# Patient Record
Sex: Male | Born: 1980 | Race: White | Hispanic: No | Marital: Single | Smoking: Former smoker
Health system: Southern US, Community
[De-identification: ages and names within clinical notes are randomized; demographics above are authoritative.]

## PROBLEM LIST (undated history)

## (undated) DIAGNOSIS — M1990 Unspecified osteoarthritis, unspecified site: Secondary | ICD-10-CM

## (undated) DIAGNOSIS — M7918 Myalgia, other site: Secondary | ICD-10-CM

## (undated) DIAGNOSIS — M5430 Sciatica, unspecified side: Secondary | ICD-10-CM

## (undated) DIAGNOSIS — F32A Depression, unspecified: Secondary | ICD-10-CM

## (undated) DIAGNOSIS — M549 Dorsalgia, unspecified: Secondary | ICD-10-CM

## (undated) DIAGNOSIS — F329 Major depressive disorder, single episode, unspecified: Secondary | ICD-10-CM

## (undated) DIAGNOSIS — F419 Anxiety disorder, unspecified: Secondary | ICD-10-CM

## (undated) DIAGNOSIS — N200 Calculus of kidney: Secondary | ICD-10-CM

## (undated) DIAGNOSIS — F17200 Nicotine dependence, unspecified, uncomplicated: Secondary | ICD-10-CM

## (undated) DIAGNOSIS — F649 Gender identity disorder, unspecified: Secondary | ICD-10-CM

## (undated) HISTORY — DX: Depression, unspecified: F32.A

## (undated) HISTORY — DX: Gender identity disorder, unspecified: F64.9

## (undated) HISTORY — PX: TONSILLECTOMY: SUR1361

## (undated) HISTORY — DX: Nicotine dependence, unspecified, uncomplicated: F17.200

## (undated) HISTORY — DX: Anxiety disorder, unspecified: F41.9

## (undated) HISTORY — DX: Major depressive disorder, single episode, unspecified: F32.9

---
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
## 2001-12-30 ENCOUNTER — Encounter: Payer: Self-pay | Admitting: Family Medicine

## 2001-12-30 ENCOUNTER — Encounter: Admission: RE | Admit: 2001-12-30 | Discharge: 2001-12-30 | Payer: Self-pay | Admitting: Family Medicine

## 2002-06-19 ENCOUNTER — Encounter: Payer: Self-pay | Admitting: Emergency Medicine

## 2002-06-19 ENCOUNTER — Emergency Department (HOSPITAL_COMMUNITY): Admission: EM | Admit: 2002-06-19 | Discharge: 2002-06-19 | Payer: Self-pay | Admitting: Emergency Medicine

## 2005-06-06 ENCOUNTER — Emergency Department (HOSPITAL_COMMUNITY): Admission: AD | Admit: 2005-06-06 | Discharge: 2005-06-06 | Payer: Self-pay | Admitting: Family Medicine

## 2005-11-20 ENCOUNTER — Emergency Department (HOSPITAL_COMMUNITY): Admission: EM | Admit: 2005-11-20 | Discharge: 2005-11-20 | Payer: Self-pay | Admitting: Emergency Medicine

## 2006-11-10 ENCOUNTER — Emergency Department (HOSPITAL_COMMUNITY): Admission: EM | Admit: 2006-11-10 | Discharge: 2006-11-10 | Payer: Self-pay | Admitting: Emergency Medicine

## 2006-11-13 ENCOUNTER — Emergency Department (HOSPITAL_COMMUNITY): Admission: EM | Admit: 2006-11-13 | Discharge: 2006-11-13 | Payer: Self-pay | Admitting: Emergency Medicine

## 2007-01-07 ENCOUNTER — Emergency Department (HOSPITAL_COMMUNITY): Admission: EM | Admit: 2007-01-07 | Discharge: 2007-01-07 | Payer: Self-pay | Admitting: Emergency Medicine

## 2008-06-30 ENCOUNTER — Emergency Department (HOSPITAL_COMMUNITY): Admission: EM | Admit: 2008-06-30 | Discharge: 2008-06-30 | Payer: Self-pay | Admitting: Emergency Medicine

## 2009-08-30 ENCOUNTER — Observation Stay (HOSPITAL_COMMUNITY): Admission: EM | Admit: 2009-08-30 | Discharge: 2009-08-30 | Payer: Self-pay | Admitting: Emergency Medicine

## 2010-06-09 LAB — URINE MICROSCOPIC-ADD ON

## 2010-06-09 LAB — URINALYSIS, ROUTINE W REFLEX MICROSCOPIC: Urobilinogen, UA: 1 mg/dL (ref 0.0–1.0)

## 2011-01-02 LAB — URINALYSIS, ROUTINE W REFLEX MICROSCOPIC
Ketones, ur: NEGATIVE
Nitrite: NEGATIVE
Protein, ur: NEGATIVE
Specific Gravity, Urine: 1.019
Specific Gravity, Urine: 1.022
Urobilinogen, UA: 0.2
Urobilinogen, UA: 1
pH: 6

## 2011-01-02 LAB — I-STAT 8, (EC8 V) (CONVERTED LAB)
Acid-Base Excess: 1
Operator id: 270111
TCO2: 29
pH, Ven: 7.346 — ABNORMAL HIGH

## 2011-01-02 LAB — URINE MICROSCOPIC-ADD ON

## 2011-11-20 ENCOUNTER — Ambulatory Visit: Payer: BC Managed Care – PPO | Attending: Physical Medicine and Rehabilitation | Admitting: Rehabilitation

## 2011-11-20 DIAGNOSIS — IMO0001 Reserved for inherently not codable concepts without codable children: Secondary | ICD-10-CM | POA: Insufficient documentation

## 2011-11-20 DIAGNOSIS — M545 Low back pain, unspecified: Secondary | ICD-10-CM | POA: Insufficient documentation

## 2011-11-20 DIAGNOSIS — M546 Pain in thoracic spine: Secondary | ICD-10-CM | POA: Insufficient documentation

## 2011-11-25 ENCOUNTER — Ambulatory Visit: Payer: BC Managed Care – PPO | Attending: Physical Medicine and Rehabilitation | Admitting: Rehabilitation

## 2011-11-25 DIAGNOSIS — M545 Low back pain, unspecified: Secondary | ICD-10-CM | POA: Insufficient documentation

## 2011-11-25 DIAGNOSIS — M546 Pain in thoracic spine: Secondary | ICD-10-CM | POA: Insufficient documentation

## 2011-11-25 DIAGNOSIS — IMO0001 Reserved for inherently not codable concepts without codable children: Secondary | ICD-10-CM | POA: Insufficient documentation

## 2011-12-02 ENCOUNTER — Ambulatory Visit: Payer: BC Managed Care – PPO | Admitting: Rehabilitation

## 2011-12-03 ENCOUNTER — Ambulatory Visit: Payer: BC Managed Care – PPO | Admitting: Rehabilitation

## 2011-12-04 ENCOUNTER — Encounter: Payer: BC Managed Care – PPO | Admitting: Rehabilitation

## 2011-12-16 ENCOUNTER — Ambulatory Visit: Payer: BC Managed Care – PPO | Admitting: Rehabilitation

## 2011-12-17 ENCOUNTER — Ambulatory Visit: Payer: BC Managed Care – PPO | Admitting: Rehabilitation

## 2011-12-18 ENCOUNTER — Ambulatory Visit: Payer: BC Managed Care – PPO | Admitting: Rehabilitation

## 2011-12-23 ENCOUNTER — Ambulatory Visit: Payer: BC Managed Care – PPO | Attending: Physical Medicine and Rehabilitation | Admitting: Rehabilitation

## 2011-12-23 DIAGNOSIS — M546 Pain in thoracic spine: Secondary | ICD-10-CM | POA: Insufficient documentation

## 2011-12-23 DIAGNOSIS — IMO0001 Reserved for inherently not codable concepts without codable children: Secondary | ICD-10-CM | POA: Insufficient documentation

## 2011-12-23 DIAGNOSIS — M545 Low back pain, unspecified: Secondary | ICD-10-CM | POA: Insufficient documentation

## 2012-10-25 ENCOUNTER — Other Ambulatory Visit: Payer: Self-pay | Admitting: Physician Assistant

## 2012-10-25 DIAGNOSIS — M47817 Spondylosis without myelopathy or radiculopathy, lumbosacral region: Secondary | ICD-10-CM

## 2012-11-02 ENCOUNTER — Other Ambulatory Visit: Payer: Self-pay | Admitting: Physician Assistant

## 2012-11-02 ENCOUNTER — Ambulatory Visit
Admission: RE | Admit: 2012-11-02 | Discharge: 2012-11-02 | Disposition: A | Discharge status: Home / Self Care | Payer: BC Managed Care – PPO | Source: Ambulatory Visit | Attending: Physician Assistant | Admitting: Physician Assistant

## 2012-11-02 VITALS — BP 135/79 | HR 69

## 2012-11-02 DIAGNOSIS — M47817 Spondylosis without myelopathy or radiculopathy, lumbosacral region: Secondary | ICD-10-CM

## 2012-11-02 DIAGNOSIS — M5126 Other intervertebral disc displacement, lumbar region: Secondary | ICD-10-CM

## 2012-11-02 MED ORDER — IOHEXOL 300 MG/ML  SOLN
1.0000 mL | Freq: Once | INTRAMUSCULAR | Status: AC | PRN
  Administered 2012-11-02: 2 mL via EPIDURAL

## 2012-11-02 MED ORDER — METHYLPREDNISOLONE ACETATE 40 MG/ML INJ SUSP (RADIOLOG
120.0000 mg | Freq: Once | INTRAMUSCULAR | Status: AC
  Administered 2012-11-02: 120 mg via EPIDURAL

## 2013-02-15 ENCOUNTER — Emergency Department (HOSPITAL_COMMUNITY)
Admission: EM | Admit: 2013-02-15 | Discharge: 2013-02-15 | Disposition: A | Discharge status: Home / Self Care | Payer: BC Managed Care – PPO | Attending: Emergency Medicine | Admitting: Emergency Medicine

## 2013-02-15 ENCOUNTER — Emergency Department (HOSPITAL_COMMUNITY): Payer: BC Managed Care – PPO

## 2013-02-15 ENCOUNTER — Encounter (HOSPITAL_COMMUNITY): Payer: Self-pay | Admitting: Emergency Medicine

## 2013-02-15 DIAGNOSIS — N200 Calculus of kidney: Secondary | ICD-10-CM | POA: Insufficient documentation

## 2013-02-15 DIAGNOSIS — Z79899 Other long term (current) drug therapy: Secondary | ICD-10-CM | POA: Insufficient documentation

## 2013-02-15 DIAGNOSIS — R112 Nausea with vomiting, unspecified: Secondary | ICD-10-CM | POA: Insufficient documentation

## 2013-02-15 DIAGNOSIS — Z8739 Personal history of other diseases of the musculoskeletal system and connective tissue: Secondary | ICD-10-CM | POA: Insufficient documentation

## 2013-02-15 HISTORY — DX: Calculus of kidney: N20.0

## 2013-02-15 HISTORY — DX: Dorsalgia, unspecified: M54.9

## 2013-02-15 HISTORY — DX: Unspecified osteoarthritis, unspecified site: M19.90

## 2013-02-15 LAB — URINALYSIS, ROUTINE W REFLEX MICROSCOPIC
Glucose, UA: NEGATIVE mg/dL
Ketones, ur: NEGATIVE mg/dL
pH: 5.5 (ref 5.0–8.0)

## 2013-02-15 LAB — BASIC METABOLIC PANEL
BUN: 12 mg/dL (ref 6–23)
Calcium: 9.2 mg/dL (ref 8.4–10.5)
Chloride: 106 mEq/L (ref 96–112)
GFR calc non Af Amer: >90 mL/min (ref >90)

## 2013-02-15 LAB — URINE MICROSCOPIC-ADD ON

## 2013-02-15 MED ORDER — ONDANSETRON 4 MG PO TBDP: 4.0000 mg | ORAL_TABLET | Freq: Three times a day (TID) | ORAL | Status: DC | PRN

## 2013-02-15 MED ORDER — HYDROMORPHONE HCL PF 1 MG/ML IJ SOLN
1.0000 mg | Freq: Once | INTRAMUSCULAR | Status: AC
  Administered 2013-02-15: 1 mg via INTRAVENOUS
  Filled 2013-02-15: qty 1

## 2013-02-15 MED ORDER — KETOROLAC TROMETHAMINE 10 MG PO TABS: 10.0000 mg | ORAL_TABLET | Freq: Four times a day (QID) | ORAL | Status: DC | PRN

## 2013-02-15 MED ORDER — KETOROLAC TROMETHAMINE 30 MG/ML IJ SOLN
30.0000 mg | Freq: Once | INTRAMUSCULAR | Status: AC
  Administered 2013-02-15: 30 mg via INTRAVENOUS
  Filled 2013-02-15: qty 1

## 2013-02-15 MED ORDER — TAMSULOSIN HCL 0.4 MG PO CAPS: 0.4000 mg | ORAL_CAPSULE | Freq: Every day | ORAL | Status: DC

## 2013-02-15 MED ORDER — ONDANSETRON HCL 4 MG/2ML IJ SOLN
4.0000 mg | Freq: Once | INTRAMUSCULAR | Status: AC
  Administered 2013-02-15: 4 mg via INTRAVENOUS
  Filled 2013-02-15: qty 2

## 2013-02-15 NOTE — ED Notes (Signed)
Pt states "I have a kidney stone." Pt states he has had them for years. Pt states pain on right lower side. Pt states blood in urine. Pt states nausea without vomiting. Pt states very painful. Skin warm and dry. NAD noted at this time. Pt ambulatory.

## 2013-02-15 NOTE — ED Provider Notes (Signed)
Medical screening examination/treatment/procedure(s) were performed by non-physician practitioner and as supervising physician I was immediately available for consultation/collaboration.  EKG Interpretation   None         Anayelli Lai N Daivd Fredericksen, DO 02/15/13 1703 

## 2013-02-15 NOTE — ED Notes (Signed)
Pt alert and mentating appropriately upon d/c. NAD noted upon d/c. Pt given d/c teaching, follow up care instructions and prescriptions. Pt verbalizes understanding of d/c teaching and has no further questions. Pt instructed not to drive and endorses significant other at bedside will be driving home. Pt ambulatory with steady gait leaving ER.

## 2013-02-15 NOTE — ED Provider Notes (Signed)
CSN: [id number]     Arrival date & time 02/15/13  4098 History   First MD Initiated Contact with Patient 02/15/13 [phone]     Chief Complaint  Patient presents with  . Nephrolithiasis   (Consider location/radiation/quality/duration/timing/severity/associated sxs/prior Treatment) HPI Comments: Patient with a history of kidney stones presents today with a chief complaint of right flank pain.  He reports that the pain has been present since yesterday.  The pain becomes worse at times.  Pain radiates to the right groin.  He reports that the pain feels similar to previous kidney stones. He also reports some associated hematuria, nausea, and vomiting.  Denies any other urinary symptoms.  Denies fever or chills.  Denies urinary retention.  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  He states that in the past he has been able to pass his kidney stones without intervention.    The history is provided by the patient.    Past Medical History  Diagnosis Date  . Kidney stones   . DJD (degenerative joint disease)   . Back pain    No past surgical history on file. No family history on file. History  Substance Use Topics  . Smoking status: Not on file  . Smokeless tobacco: Not on file  . Alcohol Use: Not on file    Review of Systems  All other systems reviewed and are negative.    Allergies  Erythromycin  Home Medications   Current Outpatient Rx  Name  Route  Sig  Dispense  Refill  . ALPRAZolam (XANAX) 1 MG tablet   Oral   Take 1 mg by mouth 2 (two) times daily.         [name] amphetamine-dextroamphetamine (ADDERALL) 20 MG tablet   Oral   Take 20 mg by mouth 3 (three) times daily.         [name] Desvenlafaxine ER (KHEDEZLA) 50 MG TB24   Oral   Take 50 mg by mouth daily.         [name] HYDROmorphone (DILAUDID) 8 MG tablet   Oral   Take 8 mg by mouth 3 (three) times daily.         . methocarbamol (ROBAXIN) 500 MG tablet   Oral   Take 500 mg by mouth 3 (three) times  daily.         . traZODone (DESYREL) 100 MG tablet   Oral   Take 100 mg by mouth at bedtime.          BP 159/93  Pulse 86  Temp(Src) 98.2 F (36.8 C) (Oral)  Resp 20  SpO2 100% Physical Exam  Nursing note and vitals reviewed. Constitutional: He appears well-developed and well-nourished.  HENT:  Head: Normocephalic and atraumatic.  Mouth/Throat: Oropharynx is clear and moist.  Neck: Normal range of motion. Neck supple.  Cardiovascular: Normal rate, regular rhythm and normal heart sounds.   Pulmonary/Chest: Effort normal and breath sounds normal.  Abdominal: Soft. Bowel sounds are normal. He exhibits no distension and no mass. There is tenderness in the right lower quadrant. There is no rigidity, no rebound and no guarding.  Right CVA tenderness  Musculoskeletal: Normal range of motion.  Neurological: He is alert.  Skin: Skin is warm and dry.  Psychiatric: He has a normal mood and affect.    ED Course  Procedures (including critical care time) Labs Review Labs Reviewed  URINALYSIS, ROUTINE W REFLEX MICROSCOPIC  BASIC METABOLIC PANEL   Imaging Review Ct Abdomen Pelvis Wo  Contrast  02/15/2013   CLINICAL DATA:  Right flank pain with a history of kidney stones  EXAM: CT ABDOMEN AND PELVIS WITHOUT CONTRAST  TECHNIQUE: Multidetector CT imaging of the abdomen and pelvis was performed following the standard protocol without intravenous contrast.  COMPARISON:  08/30/2009  FINDINGS: No focal abnormality is seen in the liver or spleen on this study performed without intravenous contrast material. The stomach, duodenum, pancreas, gallbladder, and adrenal glands are unremarkable.  No stones are present in the left kidney. No evidence for stones in the right kidney. There is mild right hydroureteronephrosis down to the level of the distal ureter where a 2 x 4 mm stone is identified about 1 cm proximal to the UVJ. No left ureteral stones. No evidence for stones in the urinary bladder.   Left-sided IVC noted. There is no abdominal aortic aneurysm. No free fluid or lymphadenopathy in the abdomen.  Imaging through the pelvis shows no free intraperitoneal fluid. No pelvic sidewall lymphadenopathy. No substantial diverticular disease in the colon. No colonic diverticulitis. The terminal ileum is normal. The appendix is normal.  Bone windows reveal no worrisome lytic or sclerotic osseous lesions.  IMPRESSION: 2 x 4 mm distal right ureteral stone causes mild right hydroureteronephrosis.   Electronically Signed   By: Kennith Center M.D.   On: 02/15/2013 11:02    EKG Interpretation   None      12:02 PM Reassessed patient.  He reports that his pain is "much better" at this time.  Patient tolerating PO liquids. MDM  No diagnosis found. Pt has been diagnosed with a Kidney Stone via CT. There is no evidence of significant hydronephrosis, serum creatine WNL, vitals sign stable and the pt does not have irratractable vomiting.  UA does not show evidence of infection.  Pain controlled during ED course.  Pt will be dc home with pain medications & has been advised to follow up with Urology.  Return precautions given.     Santiago Glad, PA-C 02/15/13 1213

## 2013-02-18 ENCOUNTER — Encounter (HOSPITAL_COMMUNITY): Payer: Self-pay | Admitting: Emergency Medicine

## 2013-02-18 ENCOUNTER — Emergency Department (HOSPITAL_COMMUNITY)
Admission: EM | Admit: 2013-02-18 | Discharge: 2013-02-18 | Disposition: A | Discharge status: Home / Self Care | Payer: BC Managed Care – PPO | Attending: Emergency Medicine | Admitting: Emergency Medicine

## 2013-02-18 DIAGNOSIS — Z881 Allergy status to other antibiotic agents status: Secondary | ICD-10-CM | POA: Insufficient documentation

## 2013-02-18 DIAGNOSIS — Z79899 Other long term (current) drug therapy: Secondary | ICD-10-CM | POA: Insufficient documentation

## 2013-02-18 DIAGNOSIS — R112 Nausea with vomiting, unspecified: Secondary | ICD-10-CM | POA: Insufficient documentation

## 2013-02-18 DIAGNOSIS — M1990 Unspecified osteoarthritis, unspecified site: Secondary | ICD-10-CM | POA: Insufficient documentation

## 2013-02-18 DIAGNOSIS — N23 Unspecified renal colic: Secondary | ICD-10-CM | POA: Diagnosis present

## 2013-02-18 DIAGNOSIS — Z87442 Personal history of urinary calculi: Secondary | ICD-10-CM | POA: Insufficient documentation

## 2013-02-18 DIAGNOSIS — Z87891 Personal history of nicotine dependence: Secondary | ICD-10-CM | POA: Insufficient documentation

## 2013-02-18 LAB — CBC WITH DIFFERENTIAL/PLATELET
Basophils Relative: 1 % (ref 0–1)
HCT: 41.5 % (ref 39.0–52.0)
Hemoglobin: 14.8 g/dL (ref 13.0–17.0)
Lymphs Abs: 2.7 10*3/uL (ref 0.7–4.0)
MCH: 31.8 pg (ref 26.0–34.0)
Monocytes Relative: 7 % (ref 3–12)
Neutro Abs: 4.3 10*3/uL (ref 1.7–7.7)
Neutrophils Relative %: 54 % (ref 43–77)
Platelets: 276 10*3/uL (ref 150–400)
RBC: 4.66 MIL/uL (ref 4.22–5.81)
WBC: 8 10*3/uL (ref 4.0–10.5)

## 2013-02-18 LAB — URINALYSIS W MICROSCOPIC + REFLEX CULTURE
Bilirubin Urine: NEGATIVE
Ketones, ur: NEGATIVE mg/dL
Nitrite: NEGATIVE
Specific Gravity, Urine: 1.012 (ref 1.005–1.030)
Urobilinogen, UA: 1 mg/dL (ref 0.0–1.0)
pH: 6.5 (ref 5.0–8.0)

## 2013-02-18 LAB — BASIC METABOLIC PANEL
BUN: 13 mg/dL (ref 6–23)
Chloride: 98 mEq/L (ref 96–112)
GFR calc Af Amer: >90 mL/min (ref >90)
GFR calc non Af Amer: >90 mL/min (ref >90)
Glucose, Bld: 130 mg/dL — ABNORMAL HIGH (ref 70–99)
Potassium: 3.3 mEq/L — ABNORMAL LOW (ref 3.5–5.1)
Sodium: 134 mEq/L — ABNORMAL LOW (ref 135–145)

## 2013-02-18 MED ORDER — PROMETHAZINE HCL 25 MG/ML IJ SOLN
25.0000 mg | Freq: Once | INTRAMUSCULAR | Status: AC
  Administered 2013-02-18: 25 mg via INTRAVENOUS
  Filled 2013-02-18: qty 1

## 2013-02-18 MED ORDER — KETOROLAC TROMETHAMINE 30 MG/ML IJ SOLN
30.0000 mg | Freq: Once | INTRAMUSCULAR | Status: AC
  Administered 2013-02-18: 30 mg via INTRAVENOUS
  Filled 2013-02-18: qty 1

## 2013-02-18 MED ORDER — SODIUM CHLORIDE 0.9 % IV BOLUS (SEPSIS)
1000.0000 mL | INTRAVENOUS | Status: AC
  Administered 2013-02-18: 1000 mL via INTRAVENOUS

## 2013-02-18 MED ORDER — HYDROMORPHONE HCL PF 1 MG/ML IJ SOLN
1.0000 mg | INTRAMUSCULAR | Status: AC
  Administered 2013-02-18: 1 mg via INTRAVENOUS
  Filled 2013-02-18: qty 1

## 2013-02-18 MED ORDER — PROMETHAZINE HCL 25 MG PO TABS: 25.0000 mg | ORAL_TABLET | Freq: Four times a day (QID) | ORAL | Status: DC | PRN

## 2013-02-18 NOTE — ED Provider Notes (Signed)
CSN: [id number]     Arrival date & time 02/18/13  0100 History   First MD Initiated Contact with Patient 02/18/13 0115     Chief Complaint  Patient presents with  . Flank Pain   (Consider location/radiation/quality/duration/timing/severity/associated sxs/prior Treatment) Patient is a 32 y.o. male presenting with flank pain. The history is provided by the patient.  Flank Pain This is a new problem. Episode onset: 3 days ago. The problem occurs constantly. The problem has not changed since onset.Pertinent negatives include no chest pain, no abdominal pain, no headaches and no shortness of breath. Nothing aggravates the symptoms. Nothing relieves the symptoms. Treatments tried: dilaudid, zofran. The treatment provided mild relief.    Past Medical History  Diagnosis Date  . Kidney stones   . DJD (degenerative joint disease)   . Back pain    History reviewed. No pertinent past surgical history. No family history on file. History  Substance Use Topics  . Smoking status: Former Games developer  . Smokeless tobacco: Not on file  . Alcohol Use: Not on file    Review of Systems  Constitutional: Negative for fever.  HENT: Negative for drooling and rhinorrhea.   Eyes: Negative for pain.  Respiratory: Negative for cough and shortness of breath.   Cardiovascular: Negative for chest pain and leg swelling.  Gastrointestinal: Positive for nausea and vomiting. Negative for abdominal pain and diarrhea.  Genitourinary: Positive for flank pain. Negative for dysuria and hematuria.  Musculoskeletal: Negative for gait problem and neck pain.  Skin: Negative for color change.  Neurological: Negative for numbness and headaches.  Hematological: Negative for adenopathy.  Psychiatric/Behavioral: Negative for behavioral problems.  All other systems reviewed and are negative.    Allergies  Erythromycin  Home Medications   Current Outpatient Rx  Name  Route  Sig  Dispense  Refill  . ALPRAZolam (XANAX) 1  MG tablet   Oral   Take 1 mg by mouth 2 (two) times daily.         [name] amphetamine-dextroamphetamine (ADDERALL) 20 MG tablet   Oral   Take 20 mg by mouth 3 (three) times daily.         [name] Desvenlafaxine ER (KHEDEZLA) 50 MG TB24   Oral   Take 50 mg by mouth daily.         [name] HYDROmorphone (DILAUDID) 8 MG tablet   Oral   Take 8 mg by mouth 3 (three) times daily.         [name] ketorolac (TORADOL) 10 MG tablet   Oral   Take 1 tablet (10 mg total) by mouth every 6 (six) hours as needed.   20 tablet   0   . methocarbamol (ROBAXIN) 500 MG tablet   Oral   Take 500 mg by mouth 3 (three) times daily.         . ondansetron (ZOFRAN ODT) 4 MG disintegrating tablet   Oral   Take 1 tablet (4 mg total) by mouth every 8 (eight) hours as needed for nausea or vomiting.   20 tablet   0   . tamsulosin (FLOMAX) 0.4 MG CAPS capsule   Oral   Take 1 capsule (0.4 mg total) by mouth daily.   30 capsule   0   . traZODone (DESYREL) 100 MG tablet   Oral   Take 100 mg by mouth at bedtime.          BP 152/92  Pulse 78  Temp(Src) 98.9 F (37.2 C) (Oral)  Resp 18  Ht 6\' 1"  (1.854 m)  Wt 210 lb (95.255 kg)  BMI 27.71 kg/m2  SpO2 97% Physical Exam  Nursing note and vitals reviewed. Constitutional: He is oriented to person, place, and time. He appears well-developed and well-nourished.  HENT:  Head: Normocephalic and atraumatic.  Right Ear: External ear normal.  Left Ear: External ear normal.  Nose: Nose normal.  Mouth/Throat: Oropharynx is clear and moist. No oropharyngeal exudate.  Eyes: Conjunctivae and EOM are normal. Pupils are equal, round, and reactive to light.  Neck: Normal range of motion. Neck supple.  Cardiovascular: Normal rate, regular rhythm, normal heart sounds and intact distal pulses.  Exam reveals no gallop and no friction rub.   No murmur heard. Pulmonary/Chest: Effort normal and breath sounds normal. No respiratory distress. He has no wheezes.  Abdominal: Soft.  Bowel sounds are normal. He exhibits no distension. There is no tenderness. There is no rebound and no guarding.  Musculoskeletal: Normal range of motion. He exhibits no edema and no tenderness.  Right CVA tenderness to palpation.  Neurological: He is alert and oriented to person, place, and time.  Skin: Skin is warm and dry.  Psychiatric: He has a normal mood and affect. His behavior is normal.    ED Course  Procedures (including critical care time) Labs Review Labs Reviewed  BASIC METABOLIC PANEL - Abnormal; Notable for the following:    Sodium 134 (*)    Potassium 3.3 (*)    Glucose, Bld 130 (*)    All other components within normal limits  URINALYSIS W MICROSCOPIC + REFLEX CULTURE - Abnormal; Notable for the following:    Hgb urine dipstick SMALL (*)    All other components within normal limits  CBC WITH DIFFERENTIAL   Imaging Review No results found.  EKG Interpretation   None       MDM   1. Renal colic    1:34 AM 32 y.o. male with a history of kidney stones presents with ongoing right flank pain. The patient was seen here several days ago and found to have a kidney stone. CT showed a 2 x 4 mm distal right ureteral stone causes mild right hydroureteronephrosis. The patient states that he takes po the lauded at home for back pain. He states that he has had ongoing right flank pain as well as nausea and some mild vomiting. He states that he feels like he can't hold down the pain medicine due to the vomiting. He denies any fevers or dysuria. He is afebrile and vital signs are unremarkable here. Will repeat screening labwork and get symptom control.  5:01 AM: Pt feeling much better. I interpreted/reviewed the labs and/or imaging which were non-contributory.   I have discussed the diagnosis/risks/treatment options with the patient and believe the pt to be eligible for discharge home to follow-up with Urology as needed. We also discussed returning to the ED immediately if new or  worsening sx occur. We discussed the sx which are most concerning (e.g., inability to tol po, fever, worsening pain) that necessitate immediate return. Any new prescriptions provided to the patient are listed below.  New Prescriptions   PROMETHAZINE (PHENERGAN) 25 MG TABLET    Take 1 tablet (25 mg total) by mouth every 6 (six) hours as needed for nausea or vomiting.     Junius Argyle, MD 02/18/13 [phone]

## 2013-02-18 NOTE — ED Notes (Signed)
Pt here for same complaint on 02/15/13. Was diagnosed with a kidney stone, given rx and told to follow up with urology when he passes the kidney stone. States that he cannot take meds due to nausea and does not want to throw up.

## 2017-03-26 DIAGNOSIS — G894 Chronic pain syndrome: Secondary | ICD-10-CM | POA: Insufficient documentation

## 2017-04-02 DIAGNOSIS — M546 Pain in thoracic spine: Secondary | ICD-10-CM | POA: Insufficient documentation

## 2017-05-11 ENCOUNTER — Emergency Department
Admission: EM | Admit: 2017-05-11 | Discharge: 2017-05-11 | Disposition: A | Discharge status: Home / Self Care | Payer: Self-pay | Attending: Emergency Medicine | Admitting: Emergency Medicine

## 2017-05-11 ENCOUNTER — Emergency Department: Payer: Self-pay

## 2017-05-11 ENCOUNTER — Other Ambulatory Visit: Payer: Self-pay

## 2017-05-11 ENCOUNTER — Encounter: Payer: Self-pay | Admitting: Emergency Medicine

## 2017-05-11 DIAGNOSIS — E860 Dehydration: Secondary | ICD-10-CM

## 2017-05-11 DIAGNOSIS — Z87891 Personal history of nicotine dependence: Secondary | ICD-10-CM | POA: Insufficient documentation

## 2017-05-11 DIAGNOSIS — R109 Unspecified abdominal pain: Secondary | ICD-10-CM

## 2017-05-11 DIAGNOSIS — R112 Nausea with vomiting, unspecified: Secondary | ICD-10-CM

## 2017-05-11 DIAGNOSIS — K2900 Acute gastritis without bleeding: Secondary | ICD-10-CM

## 2017-05-11 DIAGNOSIS — R1013 Epigastric pain: Secondary | ICD-10-CM | POA: Insufficient documentation

## 2017-05-11 DIAGNOSIS — Z79899 Other long term (current) drug therapy: Secondary | ICD-10-CM | POA: Insufficient documentation

## 2017-05-11 HISTORY — DX: Sciatica, unspecified side: M54.30

## 2017-05-11 HISTORY — DX: Myalgia, other site: M79.18

## 2017-05-11 LAB — COMPREHENSIVE METABOLIC PANEL
ALBUMIN: 4.9 g/dL (ref 3.5–5.0)
ALT: 15 U/L — ABNORMAL LOW (ref 17–63)
AST: 20 U/L (ref 15–41)
Alkaline Phosphatase: 59 U/L (ref 38–126)
Anion gap: 14 (ref 5–15)
BUN: 21 mg/dL — ABNORMAL HIGH (ref 6–20)
CALCIUM: 9.2 mg/dL (ref 8.9–10.3)
CO2: 21 mmol/L — ABNORMAL LOW (ref 22–32)
Chloride: 101 mmol/L (ref 101–111)
Creatinine, Ser: 0.98 mg/dL (ref 0.61–1.24)
GFR calc Af Amer: >60 mL/min (ref >60)
GFR calc non Af Amer: >60 mL/min (ref >60)
GLUCOSE: 117 mg/dL — AB (ref 65–99)
Potassium: 3.3 mmol/L — ABNORMAL LOW (ref 3.5–5.1)
Sodium: 136 mmol/L (ref 135–145)
Total Bilirubin: 1.6 mg/dL — ABNORMAL HIGH (ref 0.3–1.2)
Total Protein: 8.1 g/dL (ref 6.5–8.1)

## 2017-05-11 LAB — CBC
HEMATOCRIT: 52.4 % — AB (ref 40.0–52.0)
Hemoglobin: 18.1 g/dL — ABNORMAL HIGH (ref 13.0–18.0)
MCH: 30.2 pg (ref 26.0–34.0)
MCHC: 34.6 g/dL (ref 32.0–36.0)
MCV: 87.3 fL (ref 80.0–100.0)
Platelets: 380 10*3/uL (ref 150–440)
RBC: 6.01 MIL/uL — AB (ref 4.40–5.90)
RDW: 13.4 % (ref 11.5–14.5)
WBC: 11.1 10*3/uL — ABNORMAL HIGH (ref 3.8–10.6)

## 2017-05-11 LAB — URINALYSIS, COMPLETE (UACMP) WITH MICROSCOPIC
BACTERIA UA: NONE SEEN
BILIRUBIN URINE: NEGATIVE
Glucose, UA: NEGATIVE mg/dL
Hgb urine dipstick: NEGATIVE
KETONES UR: 80 mg/dL — AB
Leukocytes, UA: NEGATIVE
Nitrite: NEGATIVE
PH: 5 (ref 5.0–8.0)
PROTEIN: 30 mg/dL — AB
Specific Gravity, Urine: 1.028 (ref 1.005–1.030)

## 2017-05-11 LAB — TROPONIN I: Troponin I: <0.03 ng/mL (ref <0.03)

## 2017-05-11 LAB — LIPASE, BLOOD: LIPASE: 24 U/L (ref 11–51)

## 2017-05-11 MED ORDER — FAMOTIDINE 20 MG IN NS 100 ML IVPB: 20.0000 mg | Freq: Once | INTRAVENOUS | Status: DC

## 2017-05-11 MED ORDER — FAMOTIDINE IN NACL 20-0.9 MG/50ML-% IV SOLN
20.0000 mg | Freq: Once | INTRAVENOUS | Status: AC
  Administered 2017-05-11: 20 mg via INTRAVENOUS
  Filled 2017-05-11: qty 50

## 2017-05-11 MED ORDER — FAMOTIDINE 40 MG PO TABS: 40.0000 mg | ORAL_TABLET | Freq: Every day | ORAL | 0 refills | Status: DC

## 2017-05-11 MED ORDER — METOCLOPRAMIDE HCL 5 MG/ML IJ SOLN
10.0000 mg | Freq: Once | INTRAMUSCULAR | Status: AC
  Administered 2017-05-11: 10 mg via INTRAVENOUS
  Filled 2017-05-11: qty 2

## 2017-05-11 MED ORDER — ACETAMINOPHEN 500 MG PO TABS
1000.0000 mg | ORAL_TABLET | Freq: Once | ORAL | Status: AC
  Administered 2017-05-11: 1000 mg via ORAL
  Filled 2017-05-11: qty 2

## 2017-05-11 NOTE — ED Notes (Signed)
Pt given ginger ale, ok per Dr. Don PerkingVeronese

## 2017-05-11 NOTE — ED Triage Notes (Signed)
Pt to ed with c/o vomiting since Friday.  Pt denies diarrhea.  States last bm was Thursday and normal.  Pt also reports recent abd pain after taking pain meds at home. Pt reports he is on duragesic and dialudid for DDD, and myofacial syndrome.

## 2017-05-11 NOTE — ED Provider Notes (Signed)
[facility] Emergency Department Provider Note  ____________________________________________  Time seen: Approximately 11:02 AM  I have reviewed the triage vital signs and the nursing notes.   HISTORY  Chief Complaint Abdominal Pain and Emesis   HPI Hunter Lynch is a 37 y.o. male with a history of a chronic DJD/myofascial pain on a pain contract and chronic Dilaudid and fentanyl and kidney stoneswho presents for evaluation of nausea and vomiting. Patient reports 4 days of several daily episodes of nonbloody nonbilious emesis. Has been unable to tolerate by mouth. Few days ago the vomiting looked dark however patient is on CBD oil which also looks dark. No coffee-ground emesis or hematemesis, no melena or hematochezia. Last bowel movement was 5 days ago which is atypical for patient. He denies a history of constipation. Is also complaining of pain in the epigastric region which he describes as a burning sensation constant since this am and nonradiating. That this pain has been intermittent over the last few days. Patient is concerned that he might have an ulcer since he has been taking 3-4 naproxen that day for the last week. No prior abdominal surgeries. No fever, chills, dysuria, hematuria, chest pain or shortness of breath  Past Medical History:  Diagnosis Date  . Back pain   . DJD (degenerative joint disease)   . Kidney stones   . Myofascial pain   . Sciatica     Patient Active Problem List   Diagnosis Date Noted  . Renal colic 02/18/2013    History reviewed. No pertinent surgical history.  Prior to Admission medications   Medication Sig Start Date End Date Taking? Authorizing Provider  ALPRAZolam Prudy Feeler) 1 MG tablet Take 1 mg by mouth 2 (two) times daily as needed for anxiety.     [provider]  amphetamine-dextroamphetamine (ADDERALL) 20 MG tablet Take 20 mg by mouth 3 (three) times daily as needed (make take up to three times  daily depending on school and work schedule).     [provider]  Desvenlafaxine ER (KHEDEZLA) 50 MG TB24 Take 50 mg by mouth daily.    [provider]  famotidine (PEPCID) 40 MG tablet Take 1 tablet (40 mg total) by mouth at bedtime. 05/11/17 06/10/17  Nita Sickle, MD  HYDROmorphone (DILAUDID) 8 MG tablet Take 8 mg by mouth 3 (three) times daily. Scheduled doses for degenerative disc disease    [provider]  ketorolac (TORADOL) 10 MG tablet Take 1 tablet (10 mg total) by mouth every 6 (six) hours as needed. 02/15/13   Santiago Glad, PA-C  methocarbamol (ROBAXIN) 500 MG tablet Take 500 mg by mouth every 8 (eight) hours as needed for muscle spasms.     [provider]  ondansetron (ZOFRAN ODT) 4 MG disintegrating tablet Take 1 tablet (4 mg total) by mouth every 8 (eight) hours as needed for nausea or vomiting. 02/15/13   Santiago Glad, PA-C  promethazine (PHENERGAN) 25 MG tablet Take 1 tablet (25 mg total) by mouth every 6 (six) hours as needed for nausea or vomiting. 02/18/13   Purvis Sheffield, MD  tamsulosin (FLOMAX) 0.4 MG CAPS capsule Take 1 capsule (0.4 mg total) by mouth daily. 02/15/13   Santiago Glad, PA-C  traZODone (DESYREL) 100 MG tablet Take 100 mg by mouth at bedtime as needed.     [provider]    Allergies Erythromycin  History reviewed. No pertinent family history.  Social History Social History   Tobacco Use  . Smoking  status: Former Games developermoker  . Smokeless tobacco: Never Used  Substance Use Topics  . Alcohol use: No    Frequency: Never  . Drug use: No    Review of Systems  Constitutional: Negative for fever. Eyes: Negative for visual changes. ENT: Negative for sore throat. Neck: No neck pain  Cardiovascular: Negative for chest pain. Respiratory: Negative for shortness of breath. Gastrointestinal: + epigastric burning abdominal pain, nausea, and vomiting. No diarrhea. Genitourinary: Negative for  dysuria. Musculoskeletal: Negative for back pain. Skin: Negative for rash. Neurological: Negative for headaches, weakness or numbness. Psych: No SI or HI  ____________________________________________   PHYSICAL EXAM:  VITAL SIGNS: ED Triage Vitals [05/11/17 0853]  Enc Vitals Group     BP (!) 149/108     Pulse Rate 89     Resp 16     Temp 98.5 F (36.9 C)     Temp Source Oral     SpO2 100 %     Weight 190 lb (86.2 kg)     Height 6' (1.829 m)     Head Circumference      Peak Flow      Pain Score 8     Pain Loc      Pain Edu?      Excl. in GC?     Constitutional: Alert and oriented. Well appearing and in no apparent distress. HEENT:      Head: Normocephalic and atraumatic.         Eyes: Conjunctivae are normal. Sclera is non-icteric.       Mouth/Throat: Mucous membranes are moist.       Neck: Supple with no signs of meningismus. Cardiovascular: Regular rate and rhythm. No murmurs, gallops, or rubs. 2+ symmetrical distal pulses are present in all extremities. No JVD. Respiratory: Normal respiratory effort. Lungs are clear to auscultation bilaterally. No wheezes, crackles, or rhonchi.  Gastrointestinal: Soft, ttp over the epigastric region, and non distended with positive bowel sounds. No rebound or guarding. Genitourinary: No CVA tenderness. Musculoskeletal: Nontender with normal range of motion in all extremities. No edema, cyanosis, or erythema of extremities. Neurologic: Normal speech and language. Face is symmetric. Moving all extremities. No gross focal neurologic deficits are appreciated. Skin: Skin is warm, dry and intact. No rash noted. Psychiatric: Mood and affect are normal. Speech and behavior are normal.  ____________________________________________   LABS (all labs ordered are listed, but only abnormal results are displayed)  Labs Reviewed  COMPREHENSIVE METABOLIC PANEL - Abnormal; Notable for the following components:      Result Value   Potassium 3.3  (*)    CO2 21 (*)    Glucose, Bld 117 (*)    BUN 21 (*)    ALT 15 (*)    Total Bilirubin 1.6 (*)    All other components within normal limits  CBC - Abnormal; Notable for the following components:   WBC 11.1 (*)    RBC 6.01 (*)    Hemoglobin 18.1 (*)    HCT 52.4 (*)    All other components within normal limits  URINALYSIS, COMPLETE (UACMP) WITH MICROSCOPIC - Abnormal; Notable for the following components:   Color, Urine YELLOW (*)    APPearance HAZY (*)    Ketones, ur 80 (*)    Protein, ur 30 (*)    Squamous Epithelial / LPF 0-5 (*)    All other components within normal limits  LIPASE, BLOOD  TROPONIN I   ____________________________________________  EKG  ED ECG REPORT I, [place]  Don Perking, the attending physician, personally viewed and interpreted this ECG.  Normal sinus rhythm, rate of 66, normal intervals, normal axis, no ST elevations or depressions. Normal EKG.  ____________________________________________  RADIOLOGY  I have personally reviewed the images performed during this visit and I agree with the Radiologist's read.   Interpretation by Radiologist:  Dg Abd 2 Views  Result Date: 05/11/2017 CLINICAL DATA:  Nausea vomiting for 4 days with abdominal pain. EXAM: ABDOMEN - 2 VIEW COMPARISON:  CT 10/26/2014 FINDINGS: Upright and supine views. No free intraperitoneal air or significant air-fluid levels on upright positioning. No gaseous distention of bowel loops on supine imaging. No abnormal abdominal calcifications. No appendicolith. Distal gas and stool. IMPRESSION: No acute findings. Electronically Signed   By: Jeronimo Greaves M.D.   On: 05/11/2017 11:05     ____________________________________________   PROCEDURES  Procedure(s) performed: None Procedures Critical Care performed:  None ____________________________________________   INITIAL IMPRESSION / ASSESSMENT AND PLAN / ED COURSE   37 y.o. male with a history of a chronic DJD/myofascial pain on a  pain contract and chronic Dilaudid and fentanyl and kidney stoneswho presents for evaluation of epigastric burning abdominal pain, nausea and vomiting in the setting of large amount of NSAIDs recently. Patient is well-appearing, in no distress, is normal vital signs, looks well-hydrated, abdomen is soft with epigastric tenderness on palpation, normal bowel sounds, no rebound or guarding. Differential diagnoses includes gastritis versus GERD versus peptic ulcer disease versus pancreatitis. Less likely to be gallbladder with normal LFTs, T bili, alkaline phosphatase. Labs showing mild leukocytosis with white count of 11.1 in the setting of hemoconcentration and hemoglobin of 18.1. We'll give IV fluids, IV Reglan, IV Pepcid, and Tylenol. We'll send patient for KUB to evaluate for possible SBO although clinically patient has no signs of SBO at this time with normal bowel sounds, passing flatus, and no distention. KUB will also help determine if patient is severely constipated in the setting of chronic narcotic use.    _________________________ 1:16 PM on 05/11/2017 -----------------------------------------  KUB with no evidence of severe constipation, ileus, or SBO. UA positive for ketones concerning for dehydration. Patient was given IV fluids, IV Pepcid and, and IV Zofran. He feels markedly improved, tolerating by mouth, back to baseline. At this time patient is going to be discharged home with Pepcid. Discussed avoiding NSAIDs and using Tylenol instead. Discussed return precautions.   As part of my medical decision making, I reviewed the following data within the electronic MEDICAL RECORD NUMBER Nursing notes reviewed and incorporated, Labs reviewed , EKG interpreted , Radiograph reviewed , Notes from prior ED visits and [place] Controlled Substance Database    Pertinent labs & imaging results that were available during my care of the patient were reviewed by me and considered in my medical decision making (see  chart for details).    ____________________________________________   FINAL CLINICAL IMPRESSION(S) / ED DIAGNOSES  Final diagnoses:  Abdominal pain  Non-intractable vomiting with nausea, unspecified vomiting type  Dehydration  Acute gastritis without hemorrhage, unspecified gastritis type      NEW MEDICATIONS STARTED DURING THIS VISIT:  ED Discharge Orders        Ordered    famotidine (PEPCID) 40 MG tablet  Daily at bedtime     05/11/17 1315       Note:  This document was prepared using Dragon voice recognition software and may include unintentional dictation errors.    Don Perking, [place], MD 05/11/17 [phone]

## 2017-05-11 NOTE — Discharge Instructions (Signed)
Avoid NSAIDs, take tylenol as needed instead. Drink lots of fluids.

## 2018-01-19 ENCOUNTER — Ambulatory Visit: Payer: Self-pay | Admitting: Physician Assistant

## 2018-01-19 ENCOUNTER — Ambulatory Visit (INDEPENDENT_AMBULATORY_CARE_PROVIDER_SITE_OTHER): Payer: Self-pay | Admitting: Physician Assistant

## 2018-01-19 ENCOUNTER — Encounter: Payer: Self-pay | Admitting: Physician Assistant

## 2018-01-19 VITALS — BP 125/81 | HR 79 | Ht 73.0 in | Wt 165.0 lb

## 2018-01-19 DIAGNOSIS — Z7689 Persons encountering health services in other specified circumstances: Secondary | ICD-10-CM

## 2018-01-19 DIAGNOSIS — F640 Transsexualism: Secondary | ICD-10-CM

## 2018-01-19 DIAGNOSIS — F17200 Nicotine dependence, unspecified, uncomplicated: Secondary | ICD-10-CM | POA: Insufficient documentation

## 2018-01-19 DIAGNOSIS — E349 Endocrine disorder, unspecified: Secondary | ICD-10-CM

## 2018-01-19 DIAGNOSIS — IMO0001 Reserved for inherently not codable concepts without codable children: Secondary | ICD-10-CM

## 2018-01-19 NOTE — Progress Notes (Signed)
HPI:                                                                Hunter Lynch is a 37 y.o. adult who presents to [facility]: Primary Care [facility] Medicine today to establish care  Hunter Lynch is a pleasant 37 yo transgender male, AMAB. She was previously followed by Dr. Ruby Cola but has been off of hormone therapy for the last year Prior to this, was doing well on Depo-provera Q57months and Estradiol twice a day, for about 3 years She was intolerant to Spironolactone (fatigue, lack of sex drive)  Reports hormones plus chronic pain medication were causing weight gain. She has lost 60-70 pounds in the last year and is ready to get back on hormones  She is planning on breast augmentation in [place] in January  Reports she has "free-form silicone thorughout her body" from non-medical cosmetic procedures. 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   Depression screen PHQ 2/9 01/19/2018  Decreased Interest 1  Down, Depressed, Hopeless 2  PHQ - 2 Score 3  Altered sleeping 0  Tired, decreased energy 0  Change in appetite 0  Feeling bad or failure about yourself  2  Trouble concentrating 0  Moving slowly or fidgety/restless 0  Suicidal thoughts 0  PHQ-9 Score 5    GAD 7 : Generalized Anxiety Score 01/19/2018  Nervous, Anxious, on Edge 1  Control/stop worrying 1  Worry too much - different things 1  Trouble relaxing 0  Restless 3  Easily annoyed or irritable 1  Afraid - awful might happen 1  Total GAD 7 Score 8      Past Medical History:  Diagnosis Date  . Back pain   . DJD (degenerative joint disease)   . Gender dysphoria   . Kidney stones   . Myofascial pain   . Nicotine dependence with current use   . Sciatica    Past Surgical History:  Procedure Laterality Date  . TONSILLECTOMY     Social History   Tobacco Use  . Smoking status: Former Games developer  . Smokeless tobacco: Never Used  Substance Use Topics  . Alcohol use: No    Frequency:  Never   family history includes Breast cancer in her maternal grandmother; Hypertension in her father and paternal uncle.    ROS: negative except as noted in the HPI  Medications: Current Outpatient Medications  Medication Sig Dispense Refill  . ALPRAZolam (XANAX) 1 MG tablet Take 1 mg by mouth 2 (two) times daily as needed for anxiety.     [name] amphetamine-dextroamphetamine (ADDERALL) 20 MG tablet Take 20 mg by mouth 3 (three) times daily as needed (make take up to three times daily depending on school and work schedule).     . Desvenlafaxine ER (KHEDEZLA) 50 MG TB24 Take 50 mg by mouth daily.    . famotidine (PEPCID) 40 MG tablet Take 1 tablet (40 mg total) by mouth at bedtime. 30 tablet 0  . HYDROmorphone (DILAUDID) 8 MG tablet Take 8 mg by mouth 3 (three) times daily. Scheduled doses for degenerative disc disease    . ketorolac (TORADOL) 10 MG tablet Take 1 tablet (10 mg total) by mouth every 6 (six) hours as needed. 20 tablet 0  . methocarbamol (  ROBAXIN) 500 MG tablet Take 500 mg by mouth every 8 (eight) hours as needed for muscle spasms.     . ondansetron (ZOFRAN ODT) 4 MG disintegrating tablet Take 1 tablet (4 mg total) by mouth every 8 (eight) hours as needed for nausea or vomiting. 20 tablet 0  . promethazine (PHENERGAN) 25 MG tablet Take 1 tablet (25 mg total) by mouth every 6 (six) hours as needed for nausea or vomiting. 20 tablet 0  . tamsulosin (FLOMAX) 0.4 MG CAPS capsule Take 1 capsule (0.4 mg total) by mouth daily. 30 capsule 0  . traZODone (DESYREL) 100 MG tablet Take 100 mg by mouth at bedtime as needed.      No current facility-administered medications for this visit.    Allergies  Allergen Reactions  . Erythromycin Nausea And Vomiting       Objective:  BP 125/81   Pulse 79   Ht 6\' 1"  (1.854 m)   Wt 165 lb (74.8 kg)   BMI 21.77 kg/m  Gen:  alert, not ill-appearing, no distress, appropriate for age HEENT: head normocephalic without obvious abnormality,  conjunctiva and cornea clear, wearing glasses, trachea midline Pulm: Normal work of breathing, normal phonation, clear to auscultation bilaterally, no wheezes, rales or rhonchi CV: Normal rate, regular rhythm, s1 and s2 distinct, no murmurs, clicks or rubs  Neuro: alert and oriented x 3, no tremor MSK: extremities atraumatic, normal gait and station Skin: intact, no rashes on exposed skin, no jaundice, no cyanosis Psych: well-groomed, cooperative, good eye contact, depressed mood, affect full-range, speech is articulate, and thought processes clear and goal-directed, no SI/HI    No results found for this or any previous visit (from the past 72 hour(s)). No results found.    Assessment and Plan: 37 y.o. adult with   .Hunter Lynch was seen today for establish care.  Diagnoses and all orders for this visit:  Encounter to establish care  Endocrine disorder in male-to-male transgender person -     CBC -     Comprehensive metabolic panel -     Testosterone  Nicotine dependence with current use   - Personally reviewed PMH, PSH, PFH, medications, allergies, HM - Age-appropriate cancer screening: n/a - Influenza declined - PHQ2 positive. No acute safety issues. She is under the care of Dr. Evelene Croon and a counselor   Patient meets diagnostic criteria for gender dysphoria according to [facility] guidelines They have previously been on cross-sex hormone therapy for several years and wish to resume it Patient has the capacity to provide informed consent Written informed consent completed in office today (sent to scan) Risks and benefits of hormone therapy were reviewed with patient in detail and all questions were answered Hunter Lynch is interested in Bicalutamide as alternative to Spironolactone for androgen blockade. We discussed risk of liver injury/failure and importance of close monitoring. Will check CMP prior to initiation Baseline labs pending  Plan to start estradiol 1 mg bid and Bicalutamide 25  mg qd Follow-up in 3 months     Patient education and anticipatory guidance given Patient agrees with treatment plan Follow-up in 3 months or sooner as needed if symptoms worsen or fail to improve  Levonne Hubert PA-C

## 2018-01-30 ENCOUNTER — Encounter: Payer: Self-pay | Admitting: Physician Assistant

## 2018-01-30 DIAGNOSIS — E349 Endocrine disorder, unspecified: Secondary | ICD-10-CM | POA: Insufficient documentation

## 2018-01-30 DIAGNOSIS — Z789 Other specified health status: Secondary | ICD-10-CM | POA: Insufficient documentation

## 2018-01-30 DIAGNOSIS — IMO0001 Reserved for inherently not codable concepts without codable children: Secondary | ICD-10-CM | POA: Insufficient documentation

## 2018-01-30 DIAGNOSIS — F640 Transsexualism: Secondary | ICD-10-CM

## 2018-02-23 ENCOUNTER — Ambulatory Visit: Payer: Self-pay | Admitting: Physician Assistant

## 2018-09-01 LAB — CBC
HCT: 48.2 % (ref 38.5–50.0)
Hemoglobin: 17 g/dL (ref 13.2–17.1)
MCH: 32.2 pg (ref 27.0–33.0)
MCHC: 35.3 g/dL (ref 32.0–36.0)
MCV: 91.3 fL (ref 80.0–100.0)
MPV: 9.8 fL (ref 7.5–12.5)
PLATELETS: 317 10*3/uL (ref 140–400)
RBC: 5.28 10*6/uL (ref 4.20–5.80)
RDW: 11.7 % (ref 11.0–15.0)
WBC: 8.4 10*3/uL (ref 3.8–10.8)

## 2018-09-01 LAB — COMPREHENSIVE METABOLIC PANEL
AG Ratio: 2 (calc) (ref 1.0–2.5)
ALKALINE PHOSPHATASE (APISO): 56 U/L (ref 36–130)
ALT: 15 U/L (ref 9–46)
AST: 20 U/L (ref 10–40)
Albumin: 4.4 g/dL (ref 3.6–5.1)
BILIRUBIN TOTAL: 0.6 mg/dL (ref 0.2–1.2)
BUN: 12 mg/dL (ref 7–25)
CALCIUM: 9.5 mg/dL (ref 8.6–10.3)
CHLORIDE: 105 mmol/L (ref 98–110)
CO2: 27 mmol/L (ref 20–32)
Creat: 0.81 mg/dL (ref 0.60–1.35)
GLUCOSE: 88 mg/dL (ref 65–99)
Globulin: 2.2 g/dL (calc) (ref 1.9–3.7)
Potassium: 4.4 mmol/L (ref 3.5–5.3)
Sodium: 140 mmol/L (ref 135–146)
Total Protein: 6.6 g/dL (ref 6.1–8.1)

## 2018-09-01 LAB — TESTOSTERONE: Testosterone: 415 ng/dL (ref 250–827)

## 2018-09-06 ENCOUNTER — Ambulatory Visit (INDEPENDENT_AMBULATORY_CARE_PROVIDER_SITE_OTHER): Payer: Self-pay | Admitting: Physician Assistant

## 2018-09-06 VITALS — BP 117/80 | HR 75 | Temp 98.4°F

## 2018-09-06 DIAGNOSIS — F1921 Other psychoactive substance dependence, in remission: Secondary | ICD-10-CM

## 2018-09-06 DIAGNOSIS — IMO0001 Reserved for inherently not codable concepts without codable children: Secondary | ICD-10-CM

## 2018-09-06 DIAGNOSIS — Z9229 Personal history of other drug therapy: Secondary | ICD-10-CM

## 2018-09-06 DIAGNOSIS — F640 Transsexualism: Secondary | ICD-10-CM

## 2018-09-06 DIAGNOSIS — F17200 Nicotine dependence, unspecified, uncomplicated: Secondary | ICD-10-CM

## 2018-09-06 DIAGNOSIS — E349 Endocrine disorder, unspecified: Secondary | ICD-10-CM

## 2018-09-06 MED ORDER — SYRINGE (DISPOSABLE) 1 ML MISC: 1 refills | Status: DC

## 2018-09-06 MED ORDER — ESTRADIOL VALERATE 40 MG/ML IM OIL: TOPICAL_OIL | INTRAMUSCULAR | 3 refills | Status: DC

## 2018-09-06 MED ORDER — "BD DISP NEEDLES 25G X 5/8"" MISC": 1 refills | Status: DC

## 2018-09-06 MED ORDER — ASPIRIN EC 81 MG PO TBEC: 81.0000 mg | DELAYED_RELEASE_TABLET | Freq: Every day | ORAL | 3 refills | Status: DC

## 2018-09-06 MED ORDER — "NEEDLE (DISP) 18G X 1"" MISC": 1 refills | Status: DC

## 2018-09-06 NOTE — Progress Notes (Signed)
HPI:                                                                Hunter KimBradley J Lynch is a 38 y.o. adult who [facility] today to establish care  Hunter Lynch is a pleasant 38 yo transgender male, AMAB. She was previously followed by Dr. Ruby ColaPittaway but has decided to remain off of hormone therapy for almost 2 years. Prior to this, was doing well on Depo-provera Q83months and Estradiol twice a day, for about 3 years She was intolerant to Spironolactone (fatigue, lack of sex drive) Also reports she has "free-form silicone thorughout her body" from non-medical cosmetic procedures. 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  Today Hunter Lynch shares with me that she has been focusing on her sobriety for the last 8 months.  She reports that she was addicted to her prescription medications including Adderall, fentanyl, Dilaudid, benzodiazepines. She has been attending NA meetings daily.  She self tapered off of all of her prescription medications 5 months ago. She is followed by a counselor and [facility]  She also reports she was treated for a peptic ulcer 9-10 months ago with Prevacid. She denies any dyspepsia, melena/hematochezia.  She is very nervous to re-start her hormone therapy.    Past Medical History:  Diagnosis Date  . Anxiety   . Back pain   . Depression   . DJD (degenerative joint disease)   . Gender dysphoria   . Kidney stones   . Myofascial pain   . Nicotine dependence with current use   . Sciatica    Past Surgical History:  Procedure Laterality Date  . TONSILLECTOMY     Social History   Tobacco Use  . Smoking status: Former Games developermoker  . Smokeless tobacco: Never Used  Substance Use Topics  . Alcohol use: No    Frequency: Never   family history includes Breast cancer in her maternal grandmother; Hypertension in her father and paternal uncle.    ROS: negative except as noted  in the HPI  Medications: Current Outpatient Medications  Medication Sig Dispense Refill  . aspirin EC 81 MG tablet Take 1 tablet (81 mg total) by mouth daily. (Patient not taking: Reported on 09/20/2018) 90 tablet 3  . [START ON 10/03/2018] estradiol valerate (DELESTROGEN) 20 MG/ML injection Inject 1 mL (20 mg total) into the muscle every 7 (seven) days. 5 mL 3  . NEEDLE, DISP, 18 G 18G X 1" MISC To draw estradiol valerate 12 each 1  . NEEDLE, DISP, 25 G (BD DISP NEEDLES) 25G X 5/8" MISC To inject estradiol valerate weekly 12 each 1  . Syringe, Disposable, 1 ML MISC Use weekly for subcutaneous or intramuscular injection 12 each 1  . SYRINGE/NEEDLE, DISP, 1 ML (B-D SYRINGE/NEEDLE 1CC/25GX5/8) 25G X 5/8" 1 ML MISC For subcutaneous injection of estradiol 12 each 2   No current facility-administered medications for this visit.    Allergies  Allergen Reactions  . Duloxetine Other (See Comments)    Sweating, anxiety  . Erythromycin Nausea And Vomiting       Objective:  BP 117/80   Pulse 75   Temp 98.4 F (36.9 C) (Oral)  Vitals:   09/06/18  1440 09/06/18 1500  BP: (!) 150/98 117/80  Pulse: 75   Temp: 98.4 F (36.9 C)     Gen:  alert, not ill-appearing, no distress, appropriate for age 56: head normocephalic without obvious abnormality, conjunctiva and cornea clear, wearing glasses, trachea midline Pulm: Normal work of breathing, normal phonation, clear to auscultation bilaterally, no wheezes, rales or rhonchi CV: Normal rate, regular rhythm, s1 and s2 distinct, no murmurs, clicks or rubs  Neuro: alert and oriented x 3, no tremor MSK: extremities atraumatic, normal gait and station Skin: intact, no rashes on exposed skin, no jaundice, no cyanosis Psych: well-groomed, cooperative, good eye contact, anxious mood, affect full-range, speech is articulate, and thought processes clear and goal-directed, no SI/HI    No results found for this or any previous visit (from the past 72  hour(s)). No results found.    Assessment and Plan: 38 y.o. adult with   .Diagnoses and all orders for this visit:  Endocrine disorder in male-to-male transgender person -     Discontinue: NEEDLE, DISP, 25 G (BD DISP NEEDLES) 25G X 5/8" MISC; For subcutaneous injection weekly (Patient not taking: Reported on 09/20/2018) -     Discontinue: Syringe, Disposable, 1 ML MISC; Use weekly for subcutaneous or intramuscular injection (Patient not taking: Reported on 09/20/2018) -     Discontinue: NEEDLE, DISP, 18 G 18G X 1" MISC; To draw estradiol valerate (Patient not taking: Reported on 09/20/2018) -     Discontinue: estradiol valerate (DELESTROGEN) 40 MG/ML injection; Inject 20 mg (0.5 ml) into subcutaneous tissue every 7 days  Nicotine dependence with current use -     aspirin EC 81 MG tablet; Take 1 tablet (81 mg total) by mouth daily. (Patient not taking: Reported on 09/20/2018)  History of long-term use of multiple prescription drugs  Polysubstance dependence in early, early partial, sustained full, or sustained partial remission (HCC)    Starting estradiol valerate 20 mg weekly. Will avoid blockers due to history of intolerance and patient's desire to take fewest number of medication possible. At 20 mg weekly should be able to suppress testosterone sufficiently without the need for a blocker or provera. Patient deferred self-injection education with nurse. She has experience with Depo-provera injection in the past. Handout given on SQ injection practices. Due to tobacco use, recommend she start baby aspirin to reduce risk of VTE. Patient was counseled on increased risk of VTE with smoking and estradiol use. Denies personal hx of VTE. Patient expressed understanding.  Initial BP out of range, improved on recheck. Patient admits she has been feeling very anxious   Patient education and anticipatory guidance given Patient agrees with treatment plan Follow-up in 2 weeks or sooner as needed  if symptoms worsen or fail to improve  Darlyne Russian PA-C

## 2018-09-20 ENCOUNTER — Encounter: Payer: Self-pay | Admitting: Physician Assistant

## 2018-09-20 ENCOUNTER — Ambulatory Visit (INDEPENDENT_AMBULATORY_CARE_PROVIDER_SITE_OTHER): Payer: Self-pay | Admitting: Physician Assistant

## 2018-09-20 DIAGNOSIS — F640 Transsexualism: Secondary | ICD-10-CM

## 2018-09-20 DIAGNOSIS — IMO0001 Reserved for inherently not codable concepts without codable children: Secondary | ICD-10-CM

## 2018-09-20 DIAGNOSIS — E349 Endocrine disorder, unspecified: Secondary | ICD-10-CM

## 2018-09-20 MED ORDER — ESTRADIOL VALERATE 20 MG/ML IM OIL: 20.0000 mg | TOPICAL_OIL | INTRAMUSCULAR | 3 refills | Status: DC

## 2018-09-20 MED ORDER — "NEEDLE (DISP) 18G X 1"" MISC": 1 refills | Status: DC

## 2018-09-20 MED ORDER — SYRINGE (DISPOSABLE) 1 ML MISC: 1 refills | Status: DC

## 2018-09-20 MED ORDER — "BD DISP NEEDLES 25G X 5/8"" MISC": 1 refills | Status: DC

## 2018-09-20 NOTE — Progress Notes (Signed)
Virtual Visit via Video Note  I connected with Hunter Lynch on 09/20/18 at  2:40 PM EDT by a video enabled telemedicine application and verified that I am speaking with the correct person using two identifiers.   I discussed the limitations of evaluation and management by telemedicine and the availability of in person appointments. The patient expressed understanding and agreed to proceed.  History of Present Illness: HPI:                                                                Hunter Lynch is a 38 y.o. adult   CC: hormone therapy  Reports re-starting hormones "has been intense." She states the estradiol has made her feel more vulnerable emotionally. She reports she has actually been injecting 1.5 ml every week (3 doses so far). She has had some difficulty administering the medication subcutaneously and has used 3 needles. She also has had to use her injection needle to draw up the medication because the draw needles the pharmacy gave her do not fit on the syringe. Her last injection was yesterday (Mon 09/19/18).    Past Medical History:  Diagnosis Date  . Anxiety   . Back pain   . Depression   . DJD (degenerative joint disease)   . Gender dysphoria   . Kidney stones   . Myofascial pain   . Nicotine dependence with current use   . Sciatica    Past Surgical History:  Procedure Laterality Date  . TONSILLECTOMY     Social History   Tobacco Use  . Smoking status: Former Research scientist (life sciences)  . Smokeless tobacco: Never Used  Substance Use Topics  . Alcohol use: No    Frequency: Never   family history includes Breast cancer in her maternal grandmother; Hypertension in her father and paternal uncle.    ROS: negative except as noted in the HPI  Medications: Current Outpatient Medications  Medication Sig Dispense Refill  . aspirin EC 81 MG tablet Take 1 tablet (81 mg total) by mouth daily. (Patient not taking: Reported on 09/20/2018) 90 tablet 3  . [START ON 10/03/2018]  estradiol valerate (DELESTROGEN) 20 MG/ML injection Inject 1 mL (20 mg total) into the muscle every 7 (seven) days. 5 mL 3  . NEEDLE, DISP, 18 G 18G X 1" MISC To draw estradiol valerate 12 each 1  . NEEDLE, DISP, 25 G (BD DISP NEEDLES) 25G X 5/8" MISC To inject estradiol valerate weekly 12 each 1  . Syringe, Disposable, 1 ML MISC Use weekly for subcutaneous or intramuscular injection 12 each 1   No current facility-administered medications for this visit.    Allergies  Allergen Reactions  . Duloxetine Other (See Comments)    Sweating, anxiety  . Erythromycin Nausea And Vomiting       Objective:  BP 110/90   Pulse 89  Gen:  alert, not ill-appearing, no distress, appropriate for age 88: head normocephalic without obvious abnormality, conjunctiva and cornea clear, trachea midline Pulm: Normal work of breathing, normal phonation Neuro: alert and oriented x 3 Psych: cooperative, euthymic mood, affect mood-congruent, speech is articulate, normal rate and volume; thought processes clear and goal-directed, normal judgment, good insight  Lab Results  Component Value Date   CREATININE 0.81 08/31/2018   BUN  12 08/31/2018   NA 140 08/31/2018   K 4.4 08/31/2018   CL 105 08/31/2018   CO2 27 08/31/2018   Lab Results  Component Value Date   ALT 15 08/31/2018   AST 20 08/31/2018   ALKPHOS 59 05/11/2017   BILITOT 0.6 08/31/2018   Lab Results  Component Value Date   WBC 8.4 08/31/2018   HGB 17.0 08/31/2018   HCT 48.2 08/31/2018   MCV 91.3 08/31/2018   PLT 317 08/31/2018   Lab Results  Component Value Date   TESTOSTERONE 415 08/31/2018      Assessment and Plan: 38 y.o. adult with   .[name] was seen today for follow-up.  Diagnoses and all orders for this visit:  Endocrine disorder in male-to-male transgender person -     estradiol valerate (DELESTROGEN) 20 MG/ML injection; Inject 1 mL (20 mg total) into the muscle every 7 (seven) days. -     NEEDLE, DISP, 25 G (BD  DISP NEEDLES) 25G X 5/8" MISC; To inject estradiol valerate weekly -     NEEDLE, DISP, 18 G 18G X 1" MISC; To draw estradiol valerate -     Syringe, Disposable, 1 ML MISC; Use weekly for subcutaneous or intramuscular injection   Unintentionally overdosing her estradiol valerate. Last injection was 09/19/18. I have instructed her to hold her next dose and resume injections on 7/13 We reviewed self injection practices and I encouraged her to bring all of her supplies to our office for a nurse visit if needed Switching pharmacies due to cost I will also switch her to the lower concentration so that she does not have to waste as much medication each month Patient understands to dispose vial every 30 days  Follow-up in 2 months or sooner as needed   Follow Up Instructions:    I discussed the assessment and treatment plan with the patient. The patient was provided an opportunity to ask questions and all were answered. The patient agreed with the plan and demonstrated an understanding of the instructions.   The patient was advised to call back or seek an in-person evaluation if the symptoms worsen or if the condition fails to improve as anticipated.  I provided 5 minutes of non-face-to-face time during this encounter.   Carlis Stableharley Elizabeth Amare Bail, [place]PA-C

## 2018-09-22 ENCOUNTER — Other Ambulatory Visit: Payer: Self-pay | Admitting: Physician Assistant

## 2018-09-22 DIAGNOSIS — E349 Endocrine disorder, unspecified: Secondary | ICD-10-CM

## 2018-09-22 DIAGNOSIS — IMO0001 Reserved for inherently not codable concepts without codable children: Secondary | ICD-10-CM

## 2018-09-22 MED ORDER — "BD SYRINGE/NEEDLE 25G X 5/8"" 1 ML MISC": 2 refills | Status: DC

## 2018-09-25 ENCOUNTER — Encounter: Payer: Self-pay | Admitting: Physician Assistant

## 2018-09-25 DIAGNOSIS — F1921 Other psychoactive substance dependence, in remission: Secondary | ICD-10-CM | POA: Insufficient documentation

## 2018-09-25 DIAGNOSIS — Z9229 Personal history of other drug therapy: Secondary | ICD-10-CM | POA: Insufficient documentation

## 2019-01-16 ENCOUNTER — Telehealth: Payer: Self-pay | Admitting: Physician Assistant

## 2019-01-16 DIAGNOSIS — IMO0001 Reserved for inherently not codable concepts without codable children: Secondary | ICD-10-CM

## 2019-01-16 DIAGNOSIS — F640 Transsexualism: Secondary | ICD-10-CM

## 2019-01-16 DIAGNOSIS — E349 Endocrine disorder, unspecified: Secondary | ICD-10-CM

## 2019-01-16 NOTE — Telephone Encounter (Signed)
Patient calling in stating that they need estradiol valerate (DELESTROGEN) 20 MG/ML injection [[id number]] and needles. Sent to pharmacy on file. Patient is completely out. They have a virtual follow up appointment set up for 01/18/2019 with new pcp. Please advise.

## 2019-01-17 MED ORDER — ESTRADIOL VALERATE 20 MG/ML IM OIL: 20.0000 mg | TOPICAL_OIL | INTRAMUSCULAR | 3 refills | Status: DC

## 2019-01-17 MED ORDER — SYRINGE (DISPOSABLE) 1 ML MISC: 1 refills | Status: DC

## 2019-01-17 MED ORDER — "NEEDLE (DISP) 18G X 1"" MISC": 1 refills | Status: DC

## 2019-01-17 MED ORDER — "BD DISP NEEDLES 25G X 5/8"" MISC": 1 refills | Status: DC

## 2019-01-17 NOTE — Telephone Encounter (Signed)
Left a detailed vm msg for pt regarding med refill with supplies sent to local pharmacy. Direct call back info provided.

## 2019-01-17 NOTE — Telephone Encounter (Signed)
Sen to [facility] on file

## 2019-01-18 ENCOUNTER — Ambulatory Visit (INDEPENDENT_AMBULATORY_CARE_PROVIDER_SITE_OTHER): Payer: Self-pay | Admitting: Osteopathic Medicine

## 2019-01-18 ENCOUNTER — Encounter: Payer: Self-pay | Admitting: Osteopathic Medicine

## 2019-01-18 DIAGNOSIS — F640 Transsexualism: Secondary | ICD-10-CM

## 2019-01-18 DIAGNOSIS — E349 Endocrine disorder, unspecified: Secondary | ICD-10-CM

## 2019-01-18 DIAGNOSIS — IMO0001 Reserved for inherently not codable concepts without codable children: Secondary | ICD-10-CM

## 2019-01-18 NOTE — Progress Notes (Signed)
Virtual Visit via Video  I connected with      Hunter Lynch on 01/20/19 at 5:20 PM by a telemedicine application and verified that I am speaking with the correct person using two identifiers.  Patient is at home I am in office    I discussed the limitations of evaluation and management by telemedicine and the availability of in person appointments. The patient expressed understanding and agreed to proceed.  History of Present Illness: Hunter Lynch is a 38 y.o. adult who would like to discuss medication refills    Last labs done 08/2018, looking okay.      Observations/Objective: Temp 98.7 F (37.1 C) (Oral)   Wt 171 lb (77.6 kg)   BMI 22.56 kg/m  BP Readings from Last 3 Encounters:  09/20/18 110/90  09/06/18 117/80  01/19/18 125/81   Exam: Normal Speech.  NAD  Lab and Radiology Results No results found for this or any previous visit (from the past 72 hour(s)). No results found.     Assessment and Plan: 38 y.o. 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   PDMP not reviewed this encounter. No orders of the defined types were placed in this encounter.  Meds ordered this encounter  Medications  . Syringe, Disposable, 1 ML MISC    Sig: Use weekly for subcutaneous or intramuscular injection    Dispense:  12 each    Refill:  1  . NEEDLE, DISP, 25 G (BD DISP NEEDLES) 25G X 5/8" MISC    Sig: To inject estradiol valerate weekly    Dispense:  12 each    Refill:  1  . NEEDLE, DISP, 18 G 18G X 1" MISC    Sig: To draw estradiol valerate    Dispense:  12 each    Refill:  1  . estradiol valerate (DELESTROGEN) 20 MG/ML injection    Sig: Inject 1 mL (20 mg total) into the muscle every 7 (seven) days.    Dispense:  5 mL    Refill:  3  . clotrimazole-betamethasone (LOTRISONE) cream    Sig: Apply 1 application topically 2 (two) times daily.    Dispense:  15 g    Refill:  2  . escitalopram (LEXAPRO) 10 MG  tablet    Sig: Take 1 tablet (10 mg total) by mouth daily.    Dispense:  90 tablet    Refill:  1  . methylPREDNISolone acetate (DEPO-MEDROL) 80 MG/ML injection    Sig: Inject 1.5 mLs (120 mg total) into the muscle once for 1 dose.    Dispense:  1.5 mL    Refill:  0   Patient Instructions      Follow Up Instructions: Return in about 4 weeks (around 02/15/2019) for see how you're doing on the Lexapro and the Depo .    I discussed the assessment and treatment plan with the patient. The patient was provided an opportunity to ask questions and all were answered. The patient agreed with the plan and demonstrated an understanding of the instructions.   The patient was advised to call back or seek an in-person evaluation if any new concerns, if symptoms worsen or if the condition fails to improve as anticipated.  25 minutes of non-face-to-face time was provided during this encounter.                      Historical information moved to improve visibility of documentation.  Past  Medical History:  Diagnosis Date  . Anxiety   . Back pain   . Depression   . DJD (degenerative joint disease)   . Gender dysphoria   . Kidney stones   . Myofascial pain   . Nicotine dependence with current use   . Sciatica    Past Surgical History:  Procedure Laterality Date  . TONSILLECTOMY     Social History   Tobacco Use  . Smoking status: Former Games developermoker  . Smokeless tobacco: Never Used  Substance Use Topics  . Alcohol use: No    Frequency: Never   family history includes Breast cancer in her maternal grandmother; Hypertension in her father and paternal uncle.  Medications: Current Outpatient Medications  Medication Sig Dispense Refill  . aspirin EC 81 MG tablet Take 1 tablet (81 mg total) by mouth daily. 90 tablet 3  . estradiol valerate (DELESTROGEN) 20 MG/ML injection Inject 1 mL (20 mg total) into the muscle every 7 (seven) days. 5 mL 3  . NEEDLE, DISP, 18 G 18G X 1"  MISC To draw estradiol valerate 12 each 1  . NEEDLE, DISP, 25 G (BD DISP NEEDLES) 25G X 5/8" MISC To inject estradiol valerate weekly 12 each 1  . Syringe, Disposable, 1 ML MISC Use weekly for subcutaneous or intramuscular injection 12 each 1  . SYRINGE/NEEDLE, DISP, 1 ML (B-D SYRINGE/NEEDLE 1CC/25GX5/8) 25G X 5/8" 1 ML MISC For subcutaneous injection of estradiol 12 each 2  . clotrimazole-betamethasone (LOTRISONE) cream Apply 1 application topically 2 (two) times daily. 15 g 2  . escitalopram (LEXAPRO) 10 MG tablet Take 1 tablet (10 mg total) by mouth daily. 90 tablet 1  . methylPREDNISolone acetate (DEPO-MEDROL) 80 MG/ML injection Inject 1.5 mLs (120 mg total) into the muscle once for 1 dose. 1.5 mL 0   No current facility-administered medications for this visit.    Allergies  Allergen Reactions  . Duloxetine Other (See Comments)    Sweating, anxiety  . Erythromycin Nausea And Vomiting and Other (See Comments)    PDMP not reviewed this encounter. No orders of the defined types were placed in this encounter.  Meds ordered this encounter  Medications  . Syringe, Disposable, 1 ML MISC    Sig: Use weekly for subcutaneous or intramuscular injection    Dispense:  12 each    Refill:  1  . NEEDLE, DISP, 25 G (BD DISP NEEDLES) 25G X 5/8" MISC    Sig: To inject estradiol valerate weekly    Dispense:  12 each    Refill:  1  . NEEDLE, DISP, 18 G 18G X 1" MISC    Sig: To draw estradiol valerate    Dispense:  12 each    Refill:  1  . estradiol valerate (DELESTROGEN) 20 MG/ML injection    Sig: Inject 1 mL (20 mg total) into the muscle every 7 (seven) days.    Dispense:  5 mL    Refill:  3  . clotrimazole-betamethasone (LOTRISONE) cream    Sig: Apply 1 application topically 2 (two) times daily.    Dispense:  15 g    Refill:  2  . escitalopram (LEXAPRO) 10 MG tablet    Sig: Take 1 tablet (10 mg total) by mouth daily.    Dispense:  90 tablet    Refill:  1  . methylPREDNISolone acetate  (DEPO-MEDROL) 80 MG/ML injection    Sig: Inject 1.5 mLs (120 mg total) into the muscle once for 1 dose.    Dispense:  1.5 mL    Refill:  0

## 2019-01-20 MED ORDER — CLOTRIMAZOLE-BETAMETHASONE 1-0.05 % EX CREA: 1.0000 "application " | TOPICAL_CREAM | Freq: Two times a day (BID) | CUTANEOUS | 2 refills | Status: DC

## 2019-01-20 MED ORDER — "NEEDLE (DISP) 18G X 1"" MISC": 1 refills | Status: DC

## 2019-01-20 MED ORDER — SYRINGE (DISPOSABLE) 1 ML MISC: 1 refills | Status: DC

## 2019-01-20 MED ORDER — ESTRADIOL VALERATE 20 MG/ML IM OIL: 20.0000 mg | TOPICAL_OIL | INTRAMUSCULAR | 3 refills | Status: DC

## 2019-01-20 MED ORDER — "BD DISP NEEDLES 25G X 5/8"" MISC": 1 refills | Status: DC

## 2019-01-20 MED ORDER — METHYLPREDNISOLONE ACETATE 80 MG/ML IJ SUSP: 120.0000 mg | Freq: Once | INTRAMUSCULAR | 0 refills | Status: DC

## 2019-01-20 MED ORDER — ESCITALOPRAM OXALATE 10 MG PO TABS: 10.0000 mg | ORAL_TABLET | Freq: Every day | ORAL | 1 refills | Status: DC

## 2019-01-24 ENCOUNTER — Telehealth: Payer: Self-pay | Admitting: Osteopathic Medicine

## 2019-01-24 NOTE — Telephone Encounter (Signed)
Called patient and left a VM for patient to call back and schedule a 4 week F/u with Dr. Sheppard Coil on meds around 02/15/19

## 2019-01-24 NOTE — Telephone Encounter (Signed)
-----   Message from Emeterio Reeve, DO sent at 01/20/2019  5:21 PM EDT ----- Follow Up Instructions: Return in about 4 weeks (around 02/15/2019) for see how you're doing on the Lexapro and the Depo .  Virtual or in office is fine

## 2019-03-13 ENCOUNTER — Other Ambulatory Visit: Payer: Self-pay | Admitting: Physician Assistant

## 2019-03-13 DIAGNOSIS — E349 Endocrine disorder, unspecified: Secondary | ICD-10-CM

## 2019-03-13 DIAGNOSIS — IMO0001 Reserved for inherently not codable concepts without codable children: Secondary | ICD-10-CM

## 2019-04-17 ENCOUNTER — Other Ambulatory Visit: Payer: Self-pay | Admitting: Osteopathic Medicine

## 2019-04-17 DIAGNOSIS — E349 Endocrine disorder, unspecified: Secondary | ICD-10-CM

## 2019-04-17 DIAGNOSIS — IMO0001 Reserved for inherently not codable concepts without codable children: Secondary | ICD-10-CM

## 2019-04-18 ENCOUNTER — Telehealth: Payer: Self-pay

## 2019-04-18 DIAGNOSIS — E349 Endocrine disorder, unspecified: Secondary | ICD-10-CM

## 2019-04-18 DIAGNOSIS — IMO0001 Reserved for inherently not codable concepts without codable children: Secondary | ICD-10-CM

## 2019-04-18 MED ORDER — ESTRADIOL VALERATE 20 MG/ML IM OIL: TOPICAL_OIL | INTRAMUSCULAR | 1 refills | Status: DC

## 2019-04-18 NOTE — Telephone Encounter (Signed)
Left a message advising patient to schedule a follow up appointment.  

## 2019-04-18 NOTE — Telephone Encounter (Signed)
2 nd request -  [facility] pharmacy requesting med refill for estradiol valerate.

## 2019-04-19 ENCOUNTER — Ambulatory Visit (INDEPENDENT_AMBULATORY_CARE_PROVIDER_SITE_OTHER): Payer: Self-pay | Admitting: Osteopathic Medicine

## 2019-04-19 ENCOUNTER — Telehealth: Payer: Self-pay

## 2019-04-19 ENCOUNTER — Encounter: Payer: Self-pay | Admitting: Osteopathic Medicine

## 2019-04-19 DIAGNOSIS — F640 Transsexualism: Secondary | ICD-10-CM

## 2019-04-19 DIAGNOSIS — E349 Endocrine disorder, unspecified: Secondary | ICD-10-CM

## 2019-04-19 DIAGNOSIS — IMO0001 Reserved for inherently not codable concepts without codable children: Secondary | ICD-10-CM

## 2019-04-19 MED ORDER — SYRINGE (DISPOSABLE) 2.5 ML MISC: 1 refills | Status: DC

## 2019-04-19 MED ORDER — "NEEDLE (DISP) 18G X 1"" MISC": 1 refills | Status: DC

## 2019-04-19 MED ORDER — SYRINGE 5-6 ML 6 ML MISC: 0 refills | Status: DC

## 2019-04-19 MED ORDER — ESTRADIOL VALERATE 20 MG/ML IM OIL: 25.0000 mg | TOPICAL_OIL | INTRAMUSCULAR | 1 refills | Status: DC

## 2019-04-19 MED ORDER — METHYLPREDNISOLONE ACETATE 80 MG/ML IJ SUSP: 120.0000 mg | INTRAMUSCULAR | 1 refills | Status: DC

## 2019-04-19 MED ORDER — "BD DISP NEEDLES 25G X 5/8"" MISC": 1 refills | Status: DC

## 2019-04-19 NOTE — Telephone Encounter (Signed)
As per [facility] pharmacy -   " 2.5 ml not an available size. Next size up from 1 is the 5 ml. Pls send in a new rx. Pt also stated needles were to be sent in as well. Pt currently has one rx for needles and 2 separate injections. Thanks."

## 2019-04-19 NOTE — Telephone Encounter (Signed)
Pt will follow up with provider during today's appt regarding med refill request.

## 2019-04-19 NOTE — Progress Notes (Signed)
Virtual Visit via Video (App used: Doximity) Note  I connected with      Hunter Lynch on 04/19/19 at 3:49 PM  by a telemedicine application and verified that I am speaking with the correct person using two identifiers.  Patient is at work I am in office   I discussed the limitations of evaluation and management by telemedicine and the availability of in person appointments. The patient expressed understanding and agreed to proceed.  History of Present Illness: Hunter Lynch is a 39 y.o. adult who would like to discuss hormone management   Doing pretty well on estrogen, might want to try increasing slightly.  Is taking Depo-Provera every 3 months as well.  Nervous to try any androgen blockers, concern for potential adverse effects with sexual function.  No plans for SRS, patient feels comfortable in her body but would like more breast development if she can.  Otherwise, doing well.  Some anxiety, she never started the Lexapro, but does not feel the need to at this point.     GAD 7 : Generalized Anxiety Score 04/19/2019 01/18/2019 01/19/2018  Nervous, Anxious, on Edge 2 3 1   Control/stop worrying 2 3 1   Worry too much - different things 2 3 1   Trouble relaxing 3 1 0  Restless 1 1 3   Easily annoyed or irritable 0 2 1  Afraid - awful might happen 3 1 1   Total GAD 7 Score 13 14 8   Anxiety Difficulty Very difficult Somewhat difficult -       Observations/Objective: There were no vitals taken for this visit. BP Readings from Last 3 Encounters:  09/20/18 110/90  09/06/18 117/80  01/19/18 125/81   Exam: Normal Speech.  NAD  Lab and Radiology Results No results found for this or any previous visit (from the past 72 hour(s)). No results found.     Assessment and Plan: 39 y.o. 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   PDMP not reviewed this encounter. No orders of the defined types were placed in this  encounter.  Meds ordered this encounter  Medications  . estradiol valerate (DELESTROGEN) 20 MG/ML injection    Sig: Inject 1.25 mLs (25 mg total) into the muscle every 28 (twenty-eight) days.    Dispense:  5 mL    Refill:  1  . DISCONTD: Syringe, Disposable, 2.5 ML MISC    Sig: Use as directed with estrogen    Dispense:  25 each    Refill:  1  . methylPREDNISolone acetate (DEPO-MEDROL) 80 MG/ML injection    Sig: Inject 1.5 mLs (120 mg total) into the muscle every 3 (three) months.    Dispense:  1.5 mL    Refill:  1  . NEEDLE, DISP, 25 G (BD DISP NEEDLES) 25G X 5/8" MISC    Sig: To inject estradiol valerate weekly    Dispense:  12 each    Refill:  1  . NEEDLE, DISP, 18 G 18G X 1" MISC    Sig: To draw estradiol valerate    Dispense:  12 each    Refill:  1  . Syringe, Disposable, (5-6CC SYRINGE) 6 ML MISC    Sig: For use with estrogen    Dispense:  50 each    Refill:  0   There are no Patient Instructions on file for this visit.  Instructions sent via MyChart. If MyChart not available, pt was given option for info via personal e-mail w/  no guarantee of protected health info over unsecured e-mail communication, and MyChart sign-up instructions were sent to patient.   Follow Up Instructions: Return in about 3 months (around 07/18/2019) for recheck on hormones, call me sooner if needed! .    I discussed the assessment and treatment plan with the patient. The patient was provided an opportunity to ask questions and all were answered. The patient agreed with the plan and demonstrated an understanding of the instructions.   The patient was advised to call back or seek an in-person evaluation if any new concerns, if symptoms worsen or if the condition fails to improve as anticipated.  20 minutes of non-face-to-face time was provided during this encounter.      . . . . . . . . . . . . . [name]                   Historical information moved to improve  visibility of documentation.  Past Medical History:  Diagnosis Date  . Anxiety   . Back pain   . Depression   . DJD (degenerative joint disease)   . Gender dysphoria   . Kidney stones   . Myofascial pain   . Nicotine dependence with current use   . Sciatica    Past Surgical History:  Procedure Laterality Date  . TONSILLECTOMY     Social History   Tobacco Use  . Smoking status: Former Research scientist (life sciences)  . Smokeless tobacco: Never Used  Substance Use Topics  . Alcohol use: No   family history includes Breast cancer in her maternal grandmother; Hypertension in her father and paternal uncle.  Medications: Current Outpatient Medications  Medication Sig Dispense Refill  . estradiol valerate (DELESTROGEN) 20 MG/ML injection Inject 1.25 mLs (25 mg total) into the muscle every 28 (twenty-eight) days. 5 mL 1  . NEEDLE, DISP, 18 G 18G X 1" MISC To draw estradiol valerate 12 each 1  . NEEDLE, DISP, 25 G (BD DISP NEEDLES) 25G X 5/8" MISC To inject estradiol valerate weekly 12 each 1  . SYRINGE/NEEDLE, DISP, 1 ML (B-D SYRINGE/NEEDLE 1CC/25GX5/8) 25G X 5/8" 1 ML MISC For subcutaneous injection of estradiol 12 each 2  . aspirin EC 81 MG tablet Take 1 tablet (81 mg total) by mouth daily. (Patient not taking: Reported on 04/19/2019) 90 tablet 3  . clotrimazole-betamethasone (LOTRISONE) cream Apply 1 application topically 2 (two) times daily. (Patient not taking: Reported on 04/19/2019) 15 g 2  . methylPREDNISolone acetate (DEPO-MEDROL) 80 MG/ML injection Inject 1.5 mLs (120 mg total) into the muscle every 3 (three) months. 1.5 mL 1  . Syringe, Disposable, (5-6CC SYRINGE) 6 ML MISC For use with estrogen 50 each 0   No current facility-administered medications for this visit.   Allergies  Allergen Reactions  . Duloxetine Other (See Comments)    Sweating, anxiety  . Erythromycin Nausea And Vomiting and Other (See Comments)

## 2019-04-20 ENCOUNTER — Ambulatory Visit: Payer: Self-pay | Admitting: Medical-Surgical

## 2019-05-17 ENCOUNTER — Other Ambulatory Visit: Payer: Self-pay | Admitting: Physician Assistant

## 2019-05-17 ENCOUNTER — Other Ambulatory Visit: Payer: Self-pay | Admitting: Osteopathic Medicine

## 2019-05-17 DIAGNOSIS — E349 Endocrine disorder, unspecified: Secondary | ICD-10-CM

## 2019-05-17 DIAGNOSIS — IMO0001 Reserved for inherently not codable concepts without codable children: Secondary | ICD-10-CM

## 2019-06-26 ENCOUNTER — Other Ambulatory Visit: Payer: Self-pay

## 2019-06-26 DIAGNOSIS — E349 Endocrine disorder, unspecified: Secondary | ICD-10-CM

## 2019-06-26 DIAGNOSIS — IMO0001 Reserved for inherently not codable concepts without codable children: Secondary | ICD-10-CM

## 2019-06-26 MED ORDER — BD SYRINGE LUER-LOK 1 ML MISC: 0 refills | Status: DC

## 2019-06-26 MED ORDER — METHYLPREDNISOLONE ACETATE 80 MG/ML IJ SUSP: 120.0000 mg | INTRAMUSCULAR | 1 refills | Status: DC

## 2019-06-26 MED ORDER — "NEEDLE (DISP) 18G X 1"" MISC": 1 refills | Status: DC

## 2019-06-26 MED ORDER — "BD DISP NEEDLES 25G X 5/8"" MISC": 1 refills | Status: DC

## 2019-06-26 MED ORDER — ESTRADIOL VALERATE 20 MG/ML IM OIL: TOPICAL_OIL | INTRAMUSCULAR | 0 refills | Status: DC

## 2019-06-26 MED ORDER — SYRINGE 5-6 ML 6 ML MISC: 0 refills | Status: DC

## 2019-06-26 NOTE — Telephone Encounter (Addendum)
Pt called requesting med refills for estradiol valerate, methylprednisone acetate and syringes. Rxs pended. Pls send to [facility] pharmacy. Pt has an upcoming appt scheduled on 07/06/19.

## 2019-06-27 ENCOUNTER — Other Ambulatory Visit: Payer: Self-pay

## 2019-06-27 DIAGNOSIS — E349 Endocrine disorder, unspecified: Secondary | ICD-10-CM

## 2019-06-27 DIAGNOSIS — IMO0001 Reserved for inherently not codable concepts without codable children: Secondary | ICD-10-CM

## 2019-06-27 MED ORDER — "BD DISP NEEDLES 25G X 5/8"" MISC": 1 refills | Status: DC

## 2019-06-27 MED ORDER — METHYLPREDNISOLONE ACETATE 80 MG/ML IJ SUSP: 120.0000 mg | INTRAMUSCULAR | 1 refills | Status: DC

## 2019-06-27 MED ORDER — SYRINGE 5-6 ML 6 ML MISC: 0 refills | Status: DC

## 2019-06-27 MED ORDER — ESTRADIOL VALERATE 20 MG/ML IM OIL: TOPICAL_OIL | INTRAMUSCULAR | 0 refills | Status: DC

## 2019-06-27 MED ORDER — BD SYRINGE LUER-LOK 1 ML MISC: 0 refills | Status: DC

## 2019-06-27 MED ORDER — "NEEDLE (DISP) 18G X 1"" MISC": 1 refills | Status: DC

## 2019-06-27 NOTE — Telephone Encounter (Signed)
Pt called stating that rxs were sent to the wrong pharmacy. Requesting provider to resend all the rxs to [facility] pharmacy. Contacted [facility] pharmacy and cancelled all the rxs sent yesterday with pharmacist. Preferred pharmacy updated in chart [facility]). Rxs pended.

## 2019-07-06 ENCOUNTER — Ambulatory Visit: Payer: Self-pay | Admitting: Osteopathic Medicine

## 2019-07-06 ENCOUNTER — Ambulatory Visit: Payer: Self-pay | Admitting: Nurse Practitioner

## 2019-07-10 ENCOUNTER — Encounter: Payer: Self-pay | Admitting: Nurse Practitioner

## 2019-07-10 ENCOUNTER — Ambulatory Visit (INDEPENDENT_AMBULATORY_CARE_PROVIDER_SITE_OTHER): Payer: Self-pay | Admitting: Nurse Practitioner

## 2019-07-10 ENCOUNTER — Other Ambulatory Visit: Payer: Self-pay

## 2019-07-10 VITALS — BP 132/76 | HR 82 | Temp 98.3°F | Ht 73.0 in | Wt 212.3 lb

## 2019-07-10 DIAGNOSIS — F640 Transsexualism: Secondary | ICD-10-CM

## 2019-07-10 DIAGNOSIS — IMO0001 Reserved for inherently not codable concepts without codable children: Secondary | ICD-10-CM

## 2019-07-10 DIAGNOSIS — E349 Endocrine disorder, unspecified: Secondary | ICD-10-CM

## 2019-07-10 DIAGNOSIS — N522 Drug-induced erectile dysfunction: Secondary | ICD-10-CM

## 2019-07-10 MED ORDER — BICALUTAMIDE 50 MG PO TABS: 50.0000 mg | ORAL_TABLET | Freq: Every day | ORAL | 3 refills | Status: DC

## 2019-07-10 MED ORDER — "NEEDLE (DISP) 18G X 1"" MISC": 1 refills | Status: DC

## 2019-07-10 MED ORDER — MEDROXYPROGESTERONE ACETATE 150 MG/ML IM SUSY: 225.0000 mg | PREFILLED_SYRINGE | INTRAMUSCULAR | 0 refills | Status: DC

## 2019-07-10 MED ORDER — "BD DISP NEEDLES 25G X 5/8"" MISC": 1 refills | Status: DC

## 2019-07-10 MED ORDER — "BD SYRINGE/NEEDLE 25G X 5/8"" 1 ML MISC": 2 refills | Status: DC

## 2019-07-10 MED ORDER — TADALAFIL 5 MG PO TABS: 5.0000 mg | ORAL_TABLET | Freq: Every day | ORAL | 6 refills | Status: DC | PRN

## 2019-07-10 MED ORDER — SYRINGE 5-6 ML 6 ML MISC: 1 refills | Status: DC

## 2019-07-10 MED ORDER — MEDROXYPROGESTERONE ACETATE 150 MG/ML IM SUSP: 150.0000 mg | Freq: Once | INTRAMUSCULAR | 0 refills | Status: DC

## 2019-07-10 MED ORDER — MEDROXYPROGESTERONE ACETATE 150 MG/ML IM SUSP: 75.0000 mg | Freq: Once | INTRAMUSCULAR | 0 refills | Status: DC

## 2019-07-10 MED ORDER — ESTRADIOL VALERATE 20 MG/ML IM OIL: TOPICAL_OIL | INTRAMUSCULAR | 0 refills | Status: DC

## 2019-07-10 MED ORDER — MEDROXYPROGESTERONE ACETATE 150 MG/ML IM SUSY: 150.0000 mg | PREFILLED_SYRINGE | INTRAMUSCULAR | 0 refills | Status: DC

## 2019-07-10 NOTE — Progress Notes (Signed)
Established Patient Office Visit  Subjective:  Patient ID: Hunter Lynch, adult    DOB: 08-17-1980  Age: 39 y.o. MRN: [id number]  CC:  Chief Complaint  Patient presents with  . Hormone Management    HPI Hunter Lynch is a pleasant 39 year old transgender male presenting today for management of hormone therapy. She is doing well on her current hormone therapy, but would like to discuss increasing the dose of her estradiol and progesterone. She is also interested in discussing medication to help with breast enhancement and erectile dysfunction. She is intolerant of Spironolactone (fatigue and decreased libido).   She has been injecting 1.25 mL every week of estradiol valerate. She is tolerating this well without emotional lability or other side effects. She has also been injecting 150 mg of depo provera every 120 days.   She is interested in starting bicalutamide for breast growth and Cialis daily for erectile dysfunction.     Past Medical History:  Diagnosis Date  . Anxiety   . Back pain   . Depression   . DJD (degenerative joint disease)   . Gender dysphoria   . Kidney stones   . Myofascial pain   . Nicotine dependence with current use   . Sciatica     Past Surgical History:  Procedure Laterality Date  . TONSILLECTOMY      Family History  Problem Relation Age of Onset  . Hypertension Father   . Hypertension Paternal Uncle   . Breast cancer Maternal Grandmother     Social History   Socioeconomic History  . Marital status: Single    Spouse name: Not on file  . Number of children: Not on file  . Years of education: Not on file  . Highest education level: Not on file  Occupational History  . Not on file  Tobacco Use  . Smoking status: Former Research scientist (life sciences)  . Smokeless tobacco: Never Used  Substance and Sexual Activity  . Alcohol use: No  . Drug use: No  . Sexual activity: Not Currently    Birth control/protection: None  Other Topics Concern  . Not on file  Social  History Narrative  . Not on file   Social Determinants of Health   Financial Resource Strain:   . Difficulty of Paying Living Expenses:   Food Insecurity:   . Worried About Charity fundraiser in the Last Year:   . Arboriculturist in the Last Year:   Transportation Needs:   . Film/video editor (Medical):   [name] Lack of Transportation (Non-Medical):   Physical Activity:   . Days of Exercise per Week:   . Minutes of Exercise per Session:   Stress:   . Feeling of Stress :   Social Connections:   . Frequency of Communication with Friends and Family:   . Frequency of Social Gatherings with Friends and Family:   . Attends Religious Services:   . Active Member of Clubs or Organizations:   . Attends Archivist Meetings:   [name] Marital Status:   Intimate Partner Violence:   . Fear of Current or Ex-Partner:   . Emotionally Abused:   [name] Physically Abused:   . Sexually Abused:     Outpatient Medications Prior to Visit  Medication Sig Dispense Refill  . B-D SYRINGE LUER-LOK 1CC 1 ML MISC USE 1 SYRINGE ONCE A WEEK FOR  SUBCUTANEOUS  OR  INTRAMUSCULAR  INJECTION 12 each 0  . clotrimazole-betamethasone (LOTRISONE) cream Apply 1 application topically  2 (two) times daily. 15 g 2  . estradiol valerate (DELESTROGEN) 20 MG/ML injection INJECT 1 ML INTRAMUSCULARLY  ONCE A WEEK 5 mL 0  . medroxyPROGESTERone Acetate 150 MG/ML SUSY Inject 150 mg into the muscle every 3 (three) months.    [name] NEEDLE, DISP, 18 G 18G X 1" MISC To draw estradiol valerate 12 each 1  . NEEDLE, DISP, 25 G (BD DISP NEEDLES) 25G X 5/8" MISC To inject estradiol valerate weekly 12 each 1  . Syringe, Disposable, (5-6CC SYRINGE) 6 ML MISC For use with estrogen 50 each 0  . SYRINGE/NEEDLE, DISP, 1 ML (B-D SYRINGE/NEEDLE 1CC/25GX5/8) 25G X 5/8" 1 ML MISC For subcutaneous injection of estradiol 12 each 2  . methylPREDNISolone acetate (DEPO-MEDROL) 80 MG/ML injection Inject 1.5 mLs (120 mg total) into the muscle every 3  (three) months. 1.5 mL 1  . aspirin EC 81 MG tablet Take 1 tablet (81 mg total) by mouth daily. (Patient not taking: Reported on 04/19/2019) 90 tablet 3   No facility-administered medications prior to visit.    Allergies  Allergen Reactions  . Duloxetine Other (See Comments)    Sweating, anxiety  . Erythromycin Nausea And Vomiting and Other (See Comments)    ROS Review of Systems  Constitutional: Negative for appetite change, chills, fatigue, fever and unexpected weight change.  Respiratory: Negative for cough, chest tightness and shortness of breath.   Cardiovascular: Negative for chest pain, palpitations and leg swelling.  Gastrointestinal: Negative for abdominal distention and abdominal pain.  Genitourinary: Negative for difficulty urinating, frequency and genital sores.  Allergic/Immunologic: Positive for environmental allergies.  Neurological: Negative for weakness, light-headedness and headaches.  Psychiatric/Behavioral: Negative for agitation, decreased concentration, self-injury, sleep disturbance and suicidal ideas. The patient is not nervous/anxious.       Objective:    Physical Exam  Constitutional: She is oriented to person, place, and time. She appears well-developed and well-nourished.  HENT:  Head: Normocephalic and atraumatic.  Eyes: Pupils are equal, round, and reactive to light. Conjunctivae and EOM are normal.  Cardiovascular: Normal rate and regular rhythm.  Pulmonary/Chest: Effort normal.  Musculoskeletal:        General: Normal range of motion.     Cervical back: Neck supple.  Neurological: She is alert and oriented to person, place, and time.  Skin: Skin is warm and dry.  Psychiatric: She has a normal mood and affect. Her behavior is normal. Judgment and thought content normal.  Nursing note and vitals reviewed.   BP 132/76   Pulse 82   Temp 98.3 F (36.8 C) (Oral)   Ht 6\' 1"  (1.854 m)   Wt 212 lb 4.8 oz (96.3 kg)   SpO2 98%   BMI 28.01 kg/m   Wt Readings from Last 3 Encounters:  07/10/19 212 lb 4.8 oz (96.3 kg)  01/18/19 171 lb (77.6 kg)  01/19/18 165 lb (74.8 kg)     Health Maintenance Due  Topic Date Due  . HIV Screening  Never done    There are no preventive care reminders to display for this patient.  No results found for: TSH Lab Results  Component Value Date   WBC 8.4 08/31/2018   HGB 17.0 08/31/2018   HCT 48.2 08/31/2018   MCV 91.3 08/31/2018   PLT 317 08/31/2018   Lab Results  Component Value Date   NA 140 08/31/2018   K 4.4 08/31/2018   CO2 27 08/31/2018   GLUCOSE 88 08/31/2018   BUN 12 08/31/2018   CREATININE  0.81 08/31/2018   BILITOT 0.6 08/31/2018   ALKPHOS 59 05/11/2017   AST 20 08/31/2018   ALT 15 08/31/2018   PROT 6.6 08/31/2018   ALBUMIN 4.9 05/11/2017   CALCIUM 9.5 08/31/2018   ANIONGAP 14 05/11/2017   No results found for: CHOL No results found for: HDL No results found for: LDLCALC No results found for: TRIG No results found for: CHOLHDL No results found for: PNTI1W    Assessment & Plan:   1. 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 Will increase dose of estradiol today to 30 mg weekly injections. We will also start bicalutamide for breast development and cialis for erectile dysfunction.  Patient educated on medication changes and to notify the office immediately if you begins to experience adverse side effects.  Patient to follow-up in 3 months or sooner if needed for hormone therapy.  - estradiol valerate (DELESTROGEN) 20 MG/ML injection; Inject 1.5 mL intramuscularly once a week.  Dispense: 6 mL; Refill: 0 - medroxyPROGESTERone Acetate 150 MG/ML SUSY; Inject 1.5 mLs (225 mg total) into the muscle every 3 (three) months.  Dispense: 2 mL; Refill: 0 - medroxyPROGESTERone (DEPO-PROVERA) 150 MG/ML injection; Inject 0.5 mLs (75 mg total) into the muscle once for 1 dose.  Dispense: 0.5 mL; Refill: 0 - bicalutamide  (CASODEX) 50 MG tablet; Take 1 tablet (50 mg total) by mouth daily.  Dispense: 30 tablet; Refill: 3 - Syringe, Disposable, (5-6CC SYRINGE) 6 ML MISC; For use with estrogen  Dispense: 50 each; Refill: 1 - SYRINGE/NEEDLE, DISP, 1 ML (B-D SYRINGE/NEEDLE 1CC/25GX5/8) 25G X 5/8" 1 ML MISC; For subcutaneous injection of estradiol  Dispense: 12 each; Refill: 2 - NEEDLE, DISP, 25 G (BD DISP NEEDLES) 25G X 5/8" MISC; To inject estradiol valerate weekly  Dispense: 12 each; Refill: 1 - NEEDLE, DISP, 18 G 18G X 1" MISC; To draw estradiol valerate  Dispense: 12 each; Refill: 1  2. 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 Prescription provided for daily cialis with warning to avoid nitroglycerin while taking this medication.  Patient instructed to follow-up in 3 months or sooner if needed.  - tadalafil (CIALIS) 5 MG tablet; Take 1 tablet (5 mg total) by mouth daily as needed for erectile dysfunction.  Dispense: 30 tablet; Refill: 6   Tollie Eth, NP

## 2019-08-01 ENCOUNTER — Other Ambulatory Visit: Payer: Self-pay

## 2019-08-01 ENCOUNTER — Encounter: Payer: Self-pay | Admitting: Nurse Practitioner

## 2019-08-01 ENCOUNTER — Ambulatory Visit (INDEPENDENT_AMBULATORY_CARE_PROVIDER_SITE_OTHER): Payer: Self-pay | Admitting: Nurse Practitioner

## 2019-08-01 VITALS — BP 115/58 | HR 89 | Temp 98.1°F | Ht 73.0 in | Wt 214.0 lb

## 2019-08-01 DIAGNOSIS — N3001 Acute cystitis with hematuria: Secondary | ICD-10-CM

## 2019-08-01 DIAGNOSIS — N200 Calculus of kidney: Secondary | ICD-10-CM

## 2019-08-01 LAB — POCT URINALYSIS DIP (CLINITEK)
Bilirubin, UA: NEGATIVE
Glucose, UA: NEGATIVE mg/dL
Ketones, POC UA: NEGATIVE mg/dL
Leukocytes, UA: NEGATIVE
Nitrite, UA: NEGATIVE
POC PROTEIN,UA: NEGATIVE
Spec Grav, UA: 1.015 (ref 1.010–1.025)
Urobilinogen, UA: 0.2 E.U./dL
pH, UA: 6 (ref 5.0–8.0)

## 2019-08-01 MED ORDER — ONDANSETRON HCL 8 MG PO TABS: 8.0000 mg | ORAL_TABLET | Freq: Three times a day (TID) | ORAL | 2 refills | Status: DC | PRN

## 2019-08-01 MED ORDER — TAMSULOSIN HCL 0.4 MG PO CAPS: 0.4000 mg | ORAL_CAPSULE | Freq: Every day | ORAL | 1 refills | Status: DC

## 2019-08-01 MED ORDER — CIPROFLOXACIN HCL 500 MG PO TABS: 500.0000 mg | ORAL_TABLET | Freq: Two times a day (BID) | ORAL | 0 refills | Status: DC

## 2019-08-01 MED ORDER — KETOROLAC TROMETHAMINE 60 MG/2ML IM SOLN
60.0000 mg | Freq: Once | INTRAMUSCULAR | Status: AC
  Administered 2019-08-01: 60 mg via INTRAMUSCULAR

## 2019-08-01 MED ORDER — KETOROLAC TROMETHAMINE 10 MG PO TABS: 10.0000 mg | ORAL_TABLET | Freq: Four times a day (QID) | ORAL | 0 refills | Status: DC | PRN

## 2019-08-01 MED ORDER — KETOROLAC TROMETHAMINE 60 MG/2ML IM SOLN: 60.0000 mg | Freq: Once | INTRAMUSCULAR | Status: DC

## 2019-08-01 NOTE — Progress Notes (Signed)
Acute Office Visit  Subjective:    Patient ID: Hunter Lynch, adult    DOB: [birth year], 39 y.o.   MRN: [id number]  No chief complaint on file.   HPI Patient is in today for one week onset of left sided colicky back pain, dysuria, decreased urinary output, increased urinary frequency, and a "pulsing" sensation in the left kidney area. She noticed this morning the presence of blood in her urine and she had one episode of vomiting today. She does have a history of recurrent kidney stones and this is similar. She has been under increased stress recently with the sudden, unexpected death of her best friend and feels this may have brought this on. She denies the chance of STI as she is not currently sexually active and was recently tested negative.  She denies fever or chills.    Past Medical History:  Diagnosis Date  . Anxiety   . Back pain   . Depression   . DJD (degenerative joint disease)   . Gender dysphoria   . Kidney stones   . Myofascial pain   . Nicotine dependence with current use   . Sciatica     Past Surgical History:  Procedure Laterality Date  . TONSILLECTOMY      Family History  Problem Relation Age of Onset  . Hypertension Father   . Hypertension Paternal Uncle   . Breast cancer Maternal Grandmother     Social History   Socioeconomic History  . Marital status: Single    Spouse name: Not on file  . Number of children: Not on file  . Years of education: Not on file  . Highest education level: Not on file  Occupational History  . Not on file  Tobacco Use  . Smoking status: Former Games developer  . Smokeless tobacco: Never Used  Substance and Sexual Activity  . Alcohol use: No  . Drug use: No  . Sexual activity: Not Currently    Birth control/protection: None  Other Topics Concern  . Not on file  Social History Narrative  . Not on file   Social Determinants of Health   Financial Resource Strain:   . Difficulty of Paying Living Expenses:   Food  Insecurity:   . Worried About Programme researcher, broadcasting/film/video in the Last Year:   . Barista in the Last Year:   Transportation Needs:   . Freight forwarder (Medical):   [name] Lack of Transportation (Non-Medical):   Physical Activity:   . Days of Exercise per Week:   . Minutes of Exercise per Session:   Stress:   . Feeling of Stress :   Social Connections:   . Frequency of Communication with Friends and Family:   . Frequency of Social Gatherings with Friends and Family:   . Attends Religious Services:   . Active Member of Clubs or Organizations:   . Attends Banker Meetings:   [name] Marital Status:   Intimate Partner Violence:   . Fear of Current or Ex-Partner:   . Emotionally Abused:   [name] Physically Abused:   . Sexually Abused:     Outpatient Medications Prior to Visit  Medication Sig Dispense Refill  . B-D SYRINGE LUER-LOK 1CC 1 ML MISC USE 1 SYRINGE ONCE A WEEK FOR  SUBCUTANEOUS  OR  INTRAMUSCULAR  INJECTION 12 each 0  . bicalutamide (CASODEX) 50 MG tablet Take 1 tablet (50 mg total) by mouth daily. 30 tablet 3  . clotrimazole-betamethasone (LOTRISONE) cream  Apply 1 application topically 2 (two) times daily. 15 g 2  . estradiol valerate (DELESTROGEN) 20 MG/ML injection Inject 1.5 mL intramuscularly once a week. 6 mL 0  . medroxyPROGESTERone (DEPO-PROVERA) 150 MG/ML injection Inject 1 mL (150 mg total) into the muscle once for 1 dose. 1 mL 0  . medroxyPROGESTERone Acetate 150 MG/ML SUSY Inject 1 mL (150 mg total) into the muscle every 3 (three) months. 1 mL 0  . NEEDLE, DISP, 18 G 18G X 1" MISC To draw estradiol valerate 12 each 1  . NEEDLE, DISP, 25 G (BD DISP NEEDLES) 25G X 5/8" MISC To inject estradiol valerate weekly 12 each 1  . Syringe, Disposable, (5-6CC SYRINGE) 6 ML MISC For use with estrogen 50 each 1  . SYRINGE/NEEDLE, DISP, 1 ML (B-D SYRINGE/NEEDLE 1CC/25GX5/8) 25G X 5/8" 1 ML MISC For subcutaneous injection of estradiol 12 each 2  . tadalafil (CIALIS) 5 MG  tablet Take 1 tablet (5 mg total) by mouth daily as needed for erectile dysfunction. 30 tablet 6   No facility-administered medications prior to visit.    Allergies  Allergen Reactions  . Duloxetine Other (See Comments)    Sweating, anxiety  . Erythromycin Nausea And Vomiting and Other (See Comments)    Review of Systems  Constitutional: Negative for activity change, appetite change, chills, diaphoresis, fatigue and fever.  Respiratory: Negative for cough, chest tightness and shortness of breath.   Cardiovascular: Negative for chest pain, palpitations and leg swelling.  Gastrointestinal: Positive for abdominal pain, nausea and vomiting. Negative for abdominal distention, blood in stool, constipation and diarrhea.  Genitourinary: Positive for decreased urine volume, dysuria, flank pain, frequency, hematuria and urgency. Negative for genital sores.  Musculoskeletal: Positive for back pain.  Neurological: Negative for dizziness, tremors and weakness.  Psychiatric/Behavioral: The patient is nervous/anxious.        Objective:    Physical Exam Vitals and nursing note reviewed.  Constitutional:      Appearance: Normal appearance.  HENT:     Head: Normocephalic.  Eyes:     Extraocular Movements: Extraocular movements intact.     Conjunctiva/sclera: Conjunctivae normal.     Pupils: Pupils are equal, round, and reactive to light.  Cardiovascular:     Rate and Rhythm: Normal rate and regular rhythm.     Pulses: Normal pulses.  Pulmonary:     Effort: Pulmonary effort is normal.     Breath sounds: Normal breath sounds.  Abdominal:     General: Abdomen is flat. Bowel sounds are normal. There is no distension.     Palpations: Abdomen is soft.     Tenderness: There is left CVA tenderness. There is no right CVA tenderness.  Musculoskeletal:        General: Normal range of motion.     Cervical back: Normal range of motion.  Skin:    General: Skin is warm and dry.     Capillary  Refill: Capillary refill takes less than 2 seconds.  Neurological:     General: No focal deficit present.     Mental Status: She is alert and oriented to person, place, and time.  Psychiatric:        Mood and Affect: Mood normal.        Behavior: Behavior normal.        Thought Content: Thought content normal.        Judgment: Judgment normal.     There were no vitals taken for this visit. Wt Readings from Last 3  Encounters:  07/10/19 212 lb 4.8 oz (96.3 kg)  01/18/19 171 lb (77.6 kg)  01/19/18 165 lb (74.8 kg)    Health Maintenance Due  Topic Date Due  . HIV Screening  Never done    There are no preventive care reminders to display for this patient.   No results found for: TSH Lab Results  Component Value Date   WBC 8.4 08/31/2018   HGB 17.0 08/31/2018   HCT 48.2 08/31/2018   MCV 91.3 08/31/2018   PLT 317 08/31/2018   Lab Results  Component Value Date   NA 140 08/31/2018   K 4.4 08/31/2018   CO2 27 08/31/2018   GLUCOSE 88 08/31/2018   BUN 12 08/31/2018   CREATININE 0.81 08/31/2018   BILITOT 0.6 08/31/2018   ALKPHOS 59 05/11/2017   AST 20 08/31/2018   ALT 15 08/31/2018   PROT 6.6 08/31/2018   ALBUMIN 4.9 05/11/2017   CALCIUM 9.5 08/31/2018   ANIONGAP 14 05/11/2017   No results found for: CHOL No results found for: HDL No results found for: LDLCALC No results found for: TRIG No results found for: CHOLHDL No results found for: HGBA1C     Assessment & Plan:   1. Left nephrolithiasis Symptoms and presentation consistent with nephrolithiasis of the left kidney. Given the patients history of recurrent kidney stones, we will begin treatment today with tamsulosin and toradol. IM toradol provided today for immediate pain relief. 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 We did discuss if her pain becomes unbearable we will consider a very small quantity of pain medication. She is to call if her pain level  increases.  Will treat nausea with ondansetron as needed. Also plan to treat for possible infection with Ciprofloxacin 500mg  BID x 5 days. Patient instructed of warning signs that would warrant immediate medical attention. She is to notify the office if her symptoms worsen or persist despite treatment.  - tamsulosin (FLOMAX) 0.4 MG CAPS capsule; Take 1 capsule (0.4 mg total) by mouth daily.  Dispense: 30 capsule; Refill: 1 - ondansetron (ZOFRAN) 8 MG tablet; Take 1 tablet (8 mg total) by mouth every 8 (eight) hours as needed for nausea or vomiting. Take 1 tab (8mg ) every 8 hours as needed for nausea.  Dispense: 30 tablet; Refill: 2 - ketorolac (TORADOL) injection 60 mg - ketorolac (TORADOL) 10 MG tablet; Take 1 tablet (10 mg total) by mouth every 6 (six) hours as needed.  Dispense: 20 tablet; Refill: 0 - ciprofloxacin (CIPRO) 500 MG tablet; Take 1 tablet (500 mg total) by mouth 2 (two) times daily.  Dispense: 10 tablet; Refill: 0 - POCT URINALYSIS DIP (CLINITEK)  2. Acute cystitis with hematuria Urinalysis today reveals blood without the presence of leukocytes or nitrites. We will go ahead and treat with Cipro for suspected infection in addition to suspected kidney stone.  Patient instructed to notify the office if her symptoms worsen or fail to improve. She was also provided with information on warning signs that would warrant immediate medical attention.  Follow-up if symptoms worsen or fail to improve.  - POCT URINALYSIS DIP (CLINITEK)   Orma Render, NP

## 2019-08-01 NOTE — Patient Instructions (Addendum)
Bee, you are so strong and I want you to remember how far you have come! I am so sorry for your recent loss. Let her memory be your strength to get you through this time.   Kidney Stones  Kidney stones are solid, rock-like deposits that form inside of the kidneys. The kidneys are a pair of organs that make urine. A kidney stone may form in a kidney and move into other parts of the urinary tract, including the tubes that connect the kidneys to the bladder (ureters), the bladder, and the tube that carries urine out of the body (urethra). As the stone moves through these areas, it can cause intense pain and block the flow of urine. Kidney stones are created when high levels of certain minerals are found in the urine. The stones are usually passed out of the body through urination, but in some cases, medical treatment may be needed to remove them. What are the causes? Kidney stones may be caused by:  A condition in which certain glands produce too much parathyroid hormone (primary hyperparathyroidism), which causes too much calcium buildup in the blood.  A buildup of uric acid crystals in the bladder (hyperuricosuria). Uric acid is a chemical that the body produces when you eat certain foods. It usually exits the body in the urine.  Narrowing (stricture) of one or both of the ureters.  A kidney blockage that is present at birth (congenital obstruction).  Past surgery on the kidney or the ureters, such as gastric bypass surgery. What increases the risk? The following factors may make you more likely to develop this condition:  Having had a kidney stone in the past.  Having a family history of kidney stones.  Not drinking enough water.  Eating a diet that is high in protein, salt (sodium), or sugar.  Being overweight or obese. What are the signs or symptoms? Symptoms of a kidney stone may include:  Pain in the side of the abdomen, right below the ribs (flank pain). Pain usually spreads  (radiates) to the groin.  Needing to urinate frequently or urgently.  Painful urination.  Blood in the urine (hematuria).  Nausea.  Vomiting.  Fever and chills. How is this diagnosed? This condition may be diagnosed based on:  Your symptoms and medical history.  A physical exam.  Blood tests.  Urine tests. These may be done before and after the stone passes out of your body through urination.  Imaging tests, such as a CT scan, abdominal X-ray, or ultrasound.  A procedure to examine the inside of the bladder (cystoscopy). How is this treated? Treatment for kidney stones depends on the size, location, and makeup of the stones. Kidney stones will often pass out of the body through urination. You may need to:  Increase your fluid intake to help pass the stone. In some cases, you may be given fluids through an IV and may need to be monitored at the hospital.  Take medicine for pain.  Make changes in your diet to help prevent kidney stones from coming back. Sometimes, medical procedures are needed to remove a kidney stone. This may involve:  A procedure to break up kidney stones using: ? A focused beam of light (laser therapy). ? Shock waves (extracorporeal shock wave lithotripsy).  Surgery to remove kidney stones. This may be needed if you have severe pain or have stones that block your urinary tract. Follow these instructions at home: Medicines  Take over-the-counter and prescription medicines only as told by  your health care provider.  Ask your health care provider if the medicine prescribed to you requires you to avoid driving or using heavy machinery. Eating and drinking  Drink enough fluid to keep your urine pale yellow. You may be instructed to drink at least 8-10 glasses of water each day. This will help you pass the kidney stone.  If directed, change your diet. This may include: ? Limiting how much sodium you eat. ? Eating more fruits and  vegetables. ? Limiting how much animal protein--such as red meat, poultry, fish, and eggs--you eat.  Follow instructions from your health care provider about eating or drinking restrictions. General instructions  Collect urine samples as told by your health care provider. You may need to collect a urine sample: ? 24 hours after you pass the stone. ? 8-12 weeks after passing the kidney stone, and every 6-12 months after that.  Strain your urine every time you urinate, for as long as directed. Use the strainer that your health care provider recommends.  Do not throw out the kidney stone after passing it. Keep the stone so it can be tested by your health care provider. Testing the makeup of your kidney stone may help prevent you from getting kidney stones in the future.  Keep all follow-up visits as told by your health care provider. This is important. You may need follow-up X-rays or ultrasounds to make sure that your stone has passed. How is this prevented? To prevent another kidney stone:  Drink enough fluid to keep your urine pale yellow. This is the best way to prevent kidney stones.  Eat a healthy diet and follow recommendations from your health care provider about foods to avoid. You may be instructed to eat a low-protein diet. Recommendations vary depending on the type of kidney stone that you have.  Maintain a healthy weight. Where to find more information  National Kidney Foundation (NKF): www.kidney.org  [facility]): www.urologyhealth.org Contact a health care provider if:  You have pain that gets worse or does not get better with medicine. Get help right away if:  You have a fever or chills.  You develop severe pain.  You develop new abdominal pain.  You faint.  You are unable to urinate. Summary  Kidney stones are solid, rock-like deposits that form inside of the kidneys.  Kidney stones can cause nausea, vomiting, blood in the urine, abdominal  pain, and the urge to urinate frequently.  Treatment for kidney stones depends on the size, location, and makeup of the stones. Kidney stones will often pass out of the body through urination.  Kidney stones can be prevented by drinking enough fluids, eating a healthy diet, and maintaining a healthy weight. This information is not intended to replace advice given to you by your health care provider. Make sure you discuss any questions you have with your health care provider. Document Revised: 07/26/2018 Document Reviewed: 07/26/2018 Elsevier Patient Education  2020 [employer].

## 2019-09-14 ENCOUNTER — Other Ambulatory Visit: Payer: Self-pay

## 2019-09-14 DIAGNOSIS — IMO0001 Reserved for inherently not codable concepts without codable children: Secondary | ICD-10-CM

## 2019-09-14 MED ORDER — "BD SYRINGE/NEEDLE 25G X 5/8"" 1 ML MISC": 2 refills | Status: DC

## 2019-09-14 MED ORDER — ESTRADIOL VALERATE 20 MG/ML IM OIL: TOPICAL_OIL | INTRAMUSCULAR | 0 refills | Status: DC

## 2019-10-11 ENCOUNTER — Encounter: Payer: Self-pay | Admitting: Osteopathic Medicine

## 2019-10-11 ENCOUNTER — Ambulatory Visit (INDEPENDENT_AMBULATORY_CARE_PROVIDER_SITE_OTHER): Payer: Self-pay | Admitting: Osteopathic Medicine

## 2019-10-11 VITALS — BP 140/92 | HR 77 | Wt 198.0 lb

## 2019-10-11 DIAGNOSIS — F640 Transsexualism: Secondary | ICD-10-CM

## 2019-10-11 DIAGNOSIS — IMO0001 Reserved for inherently not codable concepts without codable children: Secondary | ICD-10-CM

## 2019-10-11 DIAGNOSIS — N522 Drug-induced erectile dysfunction: Secondary | ICD-10-CM

## 2019-10-11 DIAGNOSIS — E349 Endocrine disorder, unspecified: Secondary | ICD-10-CM

## 2019-10-11 MED ORDER — BUPROPION HCL ER (XL) 150 MG PO TB24
150.0000 mg | ORAL_TABLET | ORAL | 2 refills | Status: DC
Start: 2019-10-11 — End: 2020-07-18

## 2019-10-11 MED ORDER — TADALAFIL 5 MG PO TABS: 5.0000 mg | ORAL_TABLET | Freq: Every day | ORAL | 3 refills | Status: DC

## 2019-10-11 MED ORDER — ESTRADIOL 2 MG PO TABS: 4.0000 mg | ORAL_TABLET | Freq: Two times a day (BID) | ORAL | 2 refills | Status: DC

## 2019-10-11 NOTE — Progress Notes (Signed)
Hunter Lynch is a 39 y.o. adult who presents to  [facility] at [facility]  today, 10/11/19, seeking care for the following:  . Hormone therapy and erectile dysfunction - Rx adjusted by SaraBeth 07/10/19 --> estrogen injections 30 mg weekly, started bicalutamide 50 mg daily to hopefully help breast growth, Cialis 5 mg daily for ED. Other current Rx include Depo-Provera 150 mg q 3 mos.  . Today, patient reports doing well overall on estrogen but there has been a backorder of injectable estrogen, she has not been able to get this medication.  Requests printed prescription to have on hand for pills if needed. . Mental health has been struggling a bit lately, recent death of her friend, would like to possibly get back on Wellbutrin given history of attention deficit issues and has also taken up cigarettes again.     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 A diagnosis of Drug-induced erectile dysfunction was also pertinent to this visit.  Meds ordered this encounter  Medications  . tadalafil (CIALIS) 5 MG tablet    Sig: Take 1 tablet (5 mg total) by mouth daily.    Dispense:  90 tablet    Refill:  3  . estradiol (ESTRACE) 2 MG tablet    Sig: Take 2 tablets (4 mg total) by mouth in the morning and at bedtime.    Dispense:  120 tablet    Refill:  2  . buPROPion (WELLBUTRIN XL) 150 MG 24 hr tablet    Sig: Take 1 tablet (150 mg total) by mouth every morning.    Dispense:  30 tablet    Refill:  2       Follow-up instructions: Return in about 6 months (around 04/12/2020) for routine check-up, see me sooner if needed! .                                         BP (!) 140/92 (BP Location: Left Arm, Patient Position: Sitting)   Pulse 77   Wt 198 lb (89.8 kg)   SpO2 100%   BMI 26.12 kg/m   Current Meds   Medication Sig  . B-D SYRINGE LUER-LOK 1CC 1 ML MISC USE 1 SYRINGE ONCE A WEEK FOR  SUBCUTANEOUS  OR  INTRAMUSCULAR  INJECTION  . clotrimazole-betamethasone (LOTRISONE) cream Apply 1 application topically 2 (two) times daily.  [name] estradiol valerate (DELESTROGEN) 20 MG/ML injection Inject 1.5 mL intramuscularly once a week.  [name] ketorolac (TORADOL) 10 MG tablet Take 1 tablet (10 mg total) by mouth every 6 (six) hours as needed.  . medroxyPROGESTERone Acetate 150 MG/ML SUSY Inject 1 mL (150 mg total) into the muscle every 3 (three) months.  [name] NEEDLE, DISP, 18 G 18G X 1" MISC To draw estradiol valerate  . NEEDLE, DISP, 25 G (BD DISP NEEDLES) 25G X 5/8" MISC To inject estradiol valerate weekly  . ondansetron (ZOFRAN) 8 MG tablet Take 1 tablet (8 mg total) by mouth every 8 (eight) hours as needed for nausea or vomiting. Take 1 tab (8mg ) every 8 hours as needed for nausea.  . Syringe, Disposable, (5-6CC SYRINGE) 6 ML MISC For use with estrogen  . SYRINGE/NEEDLE, DISP, 1 ML (B-D SYRINGE/NEEDLE 1CC/25GX5/8) 25G X 5/8" 1 ML MISC For subcutaneous injection of estradiol  . tamsulosin (FLOMAX)  0.4 MG CAPS capsule Take 1 capsule (0.4 mg total) by mouth daily.  . [DISCONTINUED] tadalafil (CIALIS) 5 MG tablet Take 1 tablet (5 mg total) by mouth daily as needed for erectile dysfunction.    No results found for this or any previous visit (from the past 72 hour(s)).  No results found.     All questions at time of visit were answered - patient instructed to contact office with any additional concerns or updates.  ER/RTC precautions were reviewed with the patient as applicable.   Please note: voice recognition software was used to produce this document, and typos may escape review. Please contact Dr. Lyn Hollingshead for any needed clarifications.   Total encounter time: 15 minutes.

## 2019-10-16 ENCOUNTER — Other Ambulatory Visit: Payer: Self-pay

## 2019-10-16 DIAGNOSIS — IMO0001 Reserved for inherently not codable concepts without codable children: Secondary | ICD-10-CM

## 2019-10-16 MED ORDER — ESTRADIOL VALERATE 20 MG/ML IM OIL: TOPICAL_OIL | INTRAMUSCULAR | 5 refills | Status: DC

## 2019-10-16 MED ORDER — MEDROXYPROGESTERONE ACETATE 150 MG/ML IM SUSY: 150.0000 mg | PREFILLED_SYRINGE | INTRAMUSCULAR | 5 refills | Status: DC

## 2019-10-16 NOTE — Telephone Encounter (Signed)
Patient was seen last week, said 2 medications were suppose to be sent to [facility] pharmacy. They have been pended for review.

## 2020-01-17 ENCOUNTER — Other Ambulatory Visit: Payer: Self-pay

## 2020-01-17 DIAGNOSIS — IMO0001 Reserved for inherently not codable concepts without codable children: Secondary | ICD-10-CM

## 2020-01-17 DIAGNOSIS — Z789 Other specified health status: Secondary | ICD-10-CM

## 2020-01-17 MED ORDER — MEDROXYPROGESTERONE ACETATE 150 MG/ML IM SUSY: 150.0000 mg | PREFILLED_SYRINGE | INTRAMUSCULAR | 5 refills | Status: DC

## 2020-01-17 MED ORDER — "NEEDLE (DISP) 18G X 1"" MISC": 1 refills | Status: DC

## 2020-01-17 NOTE — Telephone Encounter (Signed)
Pt called requesting a med refill for injectable medroxyprogesterone ace. Pt is also requesting if provider can send in different gauge needles for her estradiol valerate. She is having a hard time drawing up the medication with the current needles on hand. Rx pended.

## 2020-01-17 NOTE — Telephone Encounter (Signed)
I sent Rx for smaller needles and larger ones, smaller one to inject and lerger one to draw Rx, pharmacy should have both on file, I resent Rx for larger 18G needles just in case

## 2020-01-29 NOTE — Telephone Encounter (Signed)
VM msg was left on 01/26/20 for pt with an update on requested rxs. Direct call back info provided.

## 2020-03-11 ENCOUNTER — Encounter: Payer: Self-pay | Admitting: Osteopathic Medicine

## 2020-03-11 ENCOUNTER — Ambulatory Visit (INDEPENDENT_AMBULATORY_CARE_PROVIDER_SITE_OTHER): Payer: Self-pay | Admitting: Osteopathic Medicine

## 2020-03-11 VITALS — BP 142/91 | HR 76 | Temp 98.1°F | Wt 195.6 lb

## 2020-03-11 DIAGNOSIS — N522 Drug-induced erectile dysfunction: Secondary | ICD-10-CM

## 2020-03-11 DIAGNOSIS — Z789 Other specified health status: Secondary | ICD-10-CM

## 2020-03-11 DIAGNOSIS — E349 Endocrine disorder, unspecified: Secondary | ICD-10-CM

## 2020-03-11 DIAGNOSIS — IMO0001 Reserved for inherently not codable concepts without codable children: Secondary | ICD-10-CM

## 2020-03-11 MED ORDER — SILDENAFIL CITRATE 100 MG PO TABS: 50.0000 mg | ORAL_TABLET | Freq: Every day | ORAL | 0 refills | Status: DC | PRN

## 2020-03-11 MED ORDER — TADALAFIL 20 MG PO TABS: 10.0000 mg | ORAL_TABLET | Freq: Every day | ORAL | 0 refills | Status: DC | PRN

## 2020-03-11 NOTE — Progress Notes (Signed)
Hunter Lynch is a 39 y.o. adult who presents to  [facility] at [facility]  today, 03/11/20, seeking care for the following:  . 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 A diagnosis of Drug-induced erectile dysfunction was also pertinent to this visit.   OK to trail Rx of cialis higher dose to take PRN, can also try Viagra, see which one works best and I'm ok to refill which-ever one she wants.   Need labs results - routine STI testing and labs through [facility]. Feeling good on current dose of estrogen, there were some issues getting access to injectable estrogen  No results found for this or any previous visit (from the past 24 hour(s)).  There are no Patient Instructions on file for this visit.  No orders of the defined types were placed in this encounter.   Meds ordered this encounter  Medications  . sildenafil (VIAGRA) 100 MG tablet    Sig: Take 0.5-1 tablets (50-100 mg total) by mouth daily as needed for erectile dysfunction.    Dispense:  30 tablet    Refill:  0  . tadalafil (CIALIS) 20 MG tablet    Sig: Take 0.5-1 tablets (10-20 mg total) by mouth daily as needed for erectile dysfunction.    Dispense:  30 tablet    Refill:  0       Follow-up instructions: Return in about 6 months (around 09/09/2020) for ANNUAL CHECK-UP - SEE [place] SOONER IF NEEDED.                                         BP (!) 142/91 (BP Location: Left Arm, Patient Position: Sitting, Cuff Size: Normal)   Pulse 76   Temp 98.1 F (36.7 C) (Oral)   Wt 195 lb 9.6 oz (88.7 kg)   BMI 25.81 kg/m   Current Meds  Medication Sig  . B-D SYRINGE LUER-LOK 1CC 1 ML MISC USE 1 SYRINGE ONCE A WEEK FOR   SUBCUTANEOUS  OR  INTRAMUSCULAR  INJECTION  . bicalutamide (CASODEX) 50 MG tablet Take 1 tablet (50 mg total) by mouth daily.  [name] buPROPion (WELLBUTRIN XL) 150 MG 24 hr tablet Take 1 tablet (150 mg total) by mouth every morning.  . clotrimazole-betamethasone (LOTRISONE) cream Apply 1 application topically 2 (two) times daily.  [name] estradiol valerate (DELESTROGEN) 20 MG/ML injection Inject 1.5 mL intramuscularly once a week.  [name] ketorolac (TORADOL) 10 MG tablet Take 1 tablet (10 mg total) by mouth every 6 (six) hours as needed.  . medroxyPROGESTERone Acetate 150 MG/ML SUSY Inject 1 mL (150 mg total) into the muscle every 3 (three) months.  [name] NEEDLE, DISP, 18 G 18G X 1" MISC To draw estradiol valerate  . NEEDLE, DISP, 25 G (BD DISP NEEDLES) 25G X 5/8" MISC To inject estradiol valerate weekly  . ondansetron (ZOFRAN) 8 MG tablet Take 1 tablet (8 mg total) by mouth every 8 (eight) hours as needed for nausea or vomiting. Take 1 tab (8mg ) every 8 hours as needed for nausea.  . Syringe, Disposable, (5-6CC SYRINGE) 6 ML MISC For use with estrogen  .  SYRINGE/NEEDLE, DISP, 1 ML (B-D SYRINGE/NEEDLE 1CC/25GX5/8) 25G X 5/8" 1 ML MISC For subcutaneous injection of estradiol  . tamsulosin (FLOMAX) 0.4 MG CAPS capsule Take 1 capsule (0.4 mg total) by mouth daily.  . [DISCONTINUED] tadalafil (CIALIS) 5 MG tablet Take 1 tablet (5 mg total) by mouth daily.    No results found for this or any previous visit (from the past 72 hour(s)).  No results found.     All questions at time of visit were answered - patient instructed to contact office with any additional concerns or updates.  ER/RTC precautions were reviewed with the patient as applicable.   Please note: voice recognition software was used to produce this document, and typos may escape review. Please contact Dr. Lyn Hollingshead for any needed clarifications.

## 2020-03-26 ENCOUNTER — Other Ambulatory Visit: Payer: Self-pay | Admitting: Osteopathic Medicine

## 2020-03-26 DIAGNOSIS — E349 Endocrine disorder, unspecified: Secondary | ICD-10-CM

## 2020-03-26 DIAGNOSIS — IMO0001 Reserved for inherently not codable concepts without codable children: Secondary | ICD-10-CM

## 2020-04-11 ENCOUNTER — Ambulatory Visit: Payer: Self-pay | Admitting: Osteopathic Medicine

## 2020-05-03 ENCOUNTER — Other Ambulatory Visit: Payer: Self-pay

## 2020-05-03 DIAGNOSIS — IMO0001 Reserved for inherently not codable concepts without codable children: Secondary | ICD-10-CM

## 2020-05-03 DIAGNOSIS — Z789 Other specified health status: Secondary | ICD-10-CM

## 2020-05-03 MED ORDER — "BD SYRINGE/NEEDLE 25G X 5/8"" 1 ML MISC": 2 refills | Status: DC

## 2020-05-31 ENCOUNTER — Other Ambulatory Visit: Payer: Self-pay | Admitting: Osteopathic Medicine

## 2020-05-31 DIAGNOSIS — E349 Endocrine disorder, unspecified: Secondary | ICD-10-CM

## 2020-05-31 DIAGNOSIS — IMO0001 Reserved for inherently not codable concepts without codable children: Secondary | ICD-10-CM

## 2020-07-17 ENCOUNTER — Telehealth: Payer: Self-pay | Admitting: Osteopathic Medicine

## 2020-07-17 NOTE — Telephone Encounter (Signed)
Pt called on 07/17/2020 requesting a same day appt. Pt was feeling that mental health was in trouble. Unfortunately there were no openings for scheduling. I spoke with Jeri Lager and advised patient to call [facility]. Patient was okay with this and took the phone number and address.

## 2020-07-18 ENCOUNTER — Other Ambulatory Visit: Payer: Self-pay

## 2020-07-18 ENCOUNTER — Encounter: Payer: Self-pay | Admitting: Osteopathic Medicine

## 2020-07-18 ENCOUNTER — Ambulatory Visit (INDEPENDENT_AMBULATORY_CARE_PROVIDER_SITE_OTHER): Payer: Self-pay | Admitting: Osteopathic Medicine

## 2020-07-18 VITALS — BP 134/81 | HR 90 | Temp 98.6°F | Wt 192.0 lb

## 2020-07-18 DIAGNOSIS — E349 Endocrine disorder, unspecified: Secondary | ICD-10-CM

## 2020-07-18 DIAGNOSIS — Z789 Other specified health status: Secondary | ICD-10-CM

## 2020-07-18 DIAGNOSIS — IMO0001 Reserved for inherently not codable concepts without codable children: Secondary | ICD-10-CM

## 2020-07-18 DIAGNOSIS — F332 Major depressive disorder, recurrent severe without psychotic features: Secondary | ICD-10-CM

## 2020-07-18 MED ORDER — ESTRADIOL VALERATE 20 MG/ML IM OIL: 15.0000 mg | TOPICAL_OIL | INTRAMUSCULAR | 1 refills | Status: DC

## 2020-07-18 MED ORDER — BUPROPION HCL ER (XL) 150 MG PO TB24: 150.0000 mg | ORAL_TABLET | ORAL | 1 refills | Status: DC

## 2020-07-18 MED ORDER — "NEEDLE (DISP) 18G X 1"" MISC": 1 refills | Status: DC

## 2020-07-18 MED ORDER — "BD DISP NEEDLES 25G X 5/8"" MISC": 1 refills | Status: DC

## 2020-07-18 MED ORDER — BD SYRINGE LUER-LOK 1 ML MISC
0 refills | Status: DC
Start: 2020-07-18 — End: 2021-01-24

## 2020-07-18 NOTE — Progress Notes (Signed)
Hunter Lynch is a 40 y.o. adult who presents to  [facility] at [facility]  today, 07/18/20, seeking care for the following:  . MENTAL HEALTH - worsening depression. Increased stress, struggling some w/ boyfriend's mental health as well (she is safe at home). Not sure if estrogen might be contributing? Felt injection dose was too high. Went back onto po estrogen and is taking 4 mg daily (usual dose is 4 mg bid). Had stopped Wellbutin this past fall after we restarted it 09/2019. Notable hx ADHD, GAD, MDD, ?PTSD, ?bipolar, mood fluctuations w/ HRT possible  Wt Readings from Last 3 Encounters:  07/18/20 192 lb (87.1 kg)  03/11/20 195 lb 9.6 oz (88.7 kg)  10/11/19 198 lb (89.8 kg)   GAD 7 : Generalized Anxiety Score 07/18/2020 04/19/2019 01/18/2019 01/19/2018  Nervous, Anxious, on Edge 2 2 3 1   Control/stop worrying 2 2 3 1   Worry too much - different things 2 2 3 1   Trouble relaxing 1 3 1  0  Restless 1 1 1 3   Easily annoyed or irritable 2 0 2 1  Afraid - awful might happen 3 3 1 1   Total GAD 7 Score 13 13 14 8   Anxiety Difficulty Very difficult Very difficult Somewhat difficult -    Depression screen [facility] 2/9 07/18/2020 04/19/2019 01/18/2019  Decreased Interest 1 0 1  Down, Depressed, Hopeless 2 2 1   PHQ - 2 Score 3 2 2   Altered sleeping 3 3 1   Tired, decreased energy 2 0 1  Change in appetite 3 1 3   Feeling bad or failure about yourself  3 1 3   Trouble concentrating 0 3 0  Moving slowly or fidgety/restless 1 1 0  Suicidal thoughts 3 0 1  PHQ-9 Score 18 11 11   Difficult doing work/chores Very difficult Very difficult Somewhat difficult       ASSESSMENT & PLAN with other pertinent findings:  The primary encounter diagnosis was Severe episode of recurrent major depressive disorder, without psychotic features (HCC). 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   Multiple confounding factors Will trial  adjusting estrogen and restart wellbutrin Consider bipolar d/o or mood d/o, consider treat w/ mood stabilizer Lack of insurance a barrier to psychiatric consult / some meds Concerning #9 on PHQ, pt is felt to be safe and she knows when to seek emergency care but as a transwoman she is especially reluctant to be inpatient and I can't blame her there.   There are no Patient Instructions on file for this visit.  No orders of the defined types were placed in this encounter.   No orders of the defined types were placed in this encounter.    See below for relevant physical exam findings  See below for recent lab and imaging results reviewed  Medications, allergies, PMH, PSH, SocH, FamH reviewed below    Follow-up instructions: No follow-ups on file.                                        Exam:  BP 134/81 (BP Location: Left Arm, Patient Position: Sitting, Cuff Size: Normal)   Pulse 90   Temp 98.6 F (37 C) (Oral)   Wt 192 lb (87.1 kg)   BMI 25.33 kg/m   Constitutional: VS see above. General Appearance: alert, well-developed, well-nourished, NAD  Neck:  No masses, trachea midline.   Respiratory: Normal respiratory effort.   Musculoskeletal: Gait normal.   Neurological: Normal balance/coordination. No tremor.  Skin: warm, dry, intact.   Psychiatric: Normal judgment/insight. Normal mood and affect. Oriented x3. Occasionally tearful on interview. Normal hygiene. Normal speech.   Current Meds  Medication Sig  . B-D SYRINGE LUER-LOK 1CC 1 ML MISC USE 1 SYRINGE ONCE A WEEK FOR  SUBCUTANEOUS  OR  INTRAMUSCULAR  INJECTION  . clotrimazole-betamethasone (LOTRISONE) cream Apply 1 application topically 2 (two) times daily.  [name] estradiol valerate (DELESTROGEN) 20 MG/ML injection INJECT 1 & 1/2 ML INTO MUSCLE ONCE WEEKLY  . ketorolac (TORADOL) 10 MG tablet Take 1 tablet (10 mg total) by mouth every 6 (six) hours as needed.  . medroxyPROGESTERone  Acetate 150 MG/ML SUSY Inject 1 mL (150 mg total) into the muscle every 3 (three) months.  [name] NEEDLE, DISP, 18 G 18G X 1" MISC To draw estradiol valerate  . NEEDLE, DISP, 25 G (BD DISP NEEDLES) 25G X 5/8" MISC To inject estradiol valerate weekly  . sildenafil (VIAGRA) 100 MG tablet Take 0.5-1 tablets (50-100 mg total) by mouth daily as needed for erectile dysfunction.  . Syringe, Disposable, (5-6CC SYRINGE) 6 ML MISC For use with estrogen  . SYRINGE/NEEDLE, DISP, 1 ML (B-D SYRINGE/NEEDLE 1CC/25GX5/8) 25G X 5/8" 1 ML MISC For subcutaneous injection of estradiol  . tadalafil (CIALIS) 20 MG tablet Take 0.5-1 tablets (10-20 mg total) by mouth daily as needed for erectile dysfunction.    Allergies  Allergen Reactions  . Duloxetine Other (See Comments)    Sweating, anxiety  . Erythromycin Nausea And Vomiting and Other (See Comments)    Patient Active Problem List   Diagnosis Date Noted  . History of long-term use of multiple prescription drugs 09/25/2018  . Polysubstance dependence in early, early partial, sustained full, or sustained partial remission (HCC) 09/25/2018  . 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 Nicotine dependence with current use     Family History  Problem Relation Age of Onset  . Hypertension Father   . Hypertension Paternal Uncle   . Breast cancer Maternal Grandmother     Social History   Tobacco Use  Smoking Status Former Smoker  Smokeless Tobacco Never Used    Past Surgical History:  Procedure Laterality Date  . TONSILLECTOMY      Immunization History  Administered Date(s) Administered  . Moderna Sars-Covid-2 Vaccination 05/19/2019, 06/09/2019    No results found for this or any previous visit (from the past 2160 hour(s)).  No results found.     All questions at time of visit were answered - patient instructed to contact office with any additional concerns or updates. ER/RTC precautions were reviewed with the patient  as applicable.   Please note: manual typing as well as voice recognition software may have been used to produce this document - typos may escape review. Please contact Dr. Lyn Hollingshead for any needed clarifications.

## 2020-07-18 NOTE — Patient Instructions (Addendum)
Plan: Let's get back on the injections at lower weekly dose Let's restart Wellbutrin Follow up in 1 week  For immediate mental health services open 24 hours and no referral needed:  [facility], [address] [place], [place] [address], [phone]  [facility], [address], [place], [place] [address], [phone]  [facility], [address], [place], [place] [address], [phone]  Any emergency room or 911   National Suicide Prevention [place], [phone]

## 2020-07-25 ENCOUNTER — Ambulatory Visit (INDEPENDENT_AMBULATORY_CARE_PROVIDER_SITE_OTHER): Payer: Self-pay | Admitting: Osteopathic Medicine

## 2020-07-25 ENCOUNTER — Encounter: Payer: Self-pay | Admitting: Osteopathic Medicine

## 2020-07-25 ENCOUNTER — Other Ambulatory Visit: Payer: Self-pay

## 2020-07-25 VITALS — BP 136/89 | HR 69 | Temp 98.3°F | Resp 20 | Ht 73.0 in | Wt 192.0 lb

## 2020-07-25 DIAGNOSIS — IMO0001 Reserved for inherently not codable concepts without codable children: Secondary | ICD-10-CM

## 2020-07-25 DIAGNOSIS — E349 Endocrine disorder, unspecified: Secondary | ICD-10-CM

## 2020-07-25 DIAGNOSIS — F332 Major depressive disorder, recurrent severe without psychotic features: Secondary | ICD-10-CM

## 2020-07-25 DIAGNOSIS — Z789 Other specified health status: Secondary | ICD-10-CM

## 2020-07-25 DIAGNOSIS — F1921 Other psychoactive substance dependence, in remission: Secondary | ICD-10-CM

## 2020-07-25 MED ORDER — QUETIAPINE FUMARATE 100 MG PO TABS: 50.0000 mg | ORAL_TABLET | Freq: Every day | ORAL | 0 refills | Status: DC

## 2020-07-25 NOTE — Patient Instructions (Addendum)
Plan: This week: continue Wellbutrin Next week: pick one!  start quetiapine aka Seroquel medication (see printed Rx)  increase estrogen as discussed: 0.75-1.0 mL weekly     For immediate mental health services open 24 hours and no referral needed:  [facility], [address] [place], [place] [address], [phone]  [facility], [address], [place], [place] [address], [phone]  [facility], [address] [place], [place] [address], [phone]  Any emergency room or 911   National Suicide Prevention [place], [phone]

## 2020-07-25 NOTE — Progress Notes (Signed)
Hunter Lynch is a 40 y.o. adult who presents to  [facility] at [facility]  today, 07/25/20, seeking care for the following: n  . Recheck mental health - see note from 06/28/20 last week. Restarting well butrin has been helpful for moods, she has some concern about feeling a bit numb. Trying to get back in w/ therapy but really doesn't want to do virtual sessions, trying to find someone who has expertise in addiction as well as LGBT . Still considering mood stabilizer, adjusting estrogen, see [facility]   Depression screen [facility] 2/9 07/25/2020 07/18/2020 04/19/2019  Decreased Interest 1 1 0  Down, Depressed, Hopeless 2 2 2   PHQ - 2 Score 3 3 2   Altered sleeping 2 3 3   Tired, decreased energy 2 2 0  Change in appetite 2 3 1   Feeling bad or failure about yourself  3 3 1   Trouble concentrating 0 0 3  Moving slowly or fidgety/restless 0 1 1  Suicidal thoughts 2 3 0  PHQ-9 Score 14 18 11   Difficult doing work/chores Somewhat difficult Very difficult Very difficult   GAD 7 : Generalized Anxiety Score 07/25/2020 07/18/2020 04/19/2019 01/18/2019  Nervous, Anxious, on Edge 3 2 2 3   Control/stop worrying 2 2 2 3   Worry too much - different things 2 2 2 3   Trouble relaxing 1 1 3 1   Restless 1 1 1 1   Easily annoyed or irritable 0 2 0 2  Afraid - awful might happen 0 3 3 1   Total GAD 7 Score 9 13 13 14   Anxiety Difficulty Somewhat difficult Very difficult Very difficult Somewhat difficult        ASSESSMENT & PLAN with other pertinent findings:  The primary encounter diagnosis was Severe episode of recurrent major depressive disorder, without psychotic features (HCC). 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   Suspect Bipolar 2 d/o Will work on getting connnected w/ therapist See pt instructions.   Patient Instructions  Plan: This week: continue Wellbutrin Next week: pick  one!  start quetiapine aka Seroquel medication (see printed Rx)  increase estrogen as discussed: 0.75-1.0 mL weekly     For immediate mental health services open 24 hours and no referral needed:  [facility], [address], [place], 04/21/2019 01/20/2019,  [facility], [address], [place],  , [phone]  [facility], [address] [place], [place], [phone]  Any emergency room or 911   National Suicide Prevention [place], [phone]    No orders of the defined types were placed in this encounter.   Meds ordered this encounter  Medications  . QUEtiapine (SEROQUEL) 100 MG tablet    Sig: Take 0.5 tablets (50 mg total) by mouth at bedtime. After one week, can increase to 1 tablet (100 mg) po qhs    Dispense:  90 tablet    Refill:  0     See below for relevant physical exam findings  See below for recent lab and imaging results reviewed  Medications, allergies, PMH, PSH, SocH, FamH reviewed below    Follow-up instructions: Return in about 4 weeks (around 08/22/2020) for monitor on med changes, see [address] sooner if needed - call or message [place]! [place]  Exam:  BP 136/89 (BP Location: Left Arm, Patient Position: Sitting, Cuff Size: Normal)   Pulse 69   Temp 98.3 F (36.8 C) (Oral)   Resp 20   Ht 6\' 1"  (1.854 m)   Wt 192 lb (87.1 kg)   SpO2 100%   BMI 25.33 kg/m   Constitutional: VS see above. General Appearance: alert, well-developed, well-nourished, NAD  Psychiatric: Normal judgment/insight. Normal mood and affect. Oriented x3.   Current Meds  Medication Sig  . B-D SYRINGE LUER-LOK 1CC 1 ML MISC USE 1 SYRINGE ONCE A WEEK FOR  SUBCUTANEOUS  OR  INTRAMUSCULAR  INJECTION  . buPROPion (WELLBUTRIN XL) 150 MG 24 hr tablet Take 1 tablet (150 mg total) by mouth every morning.   estradiol valerate (DELESTROGEN) 20 MG/ML injection Inject 0.75 mLs (15 mg total) into the muscle once a week.  . medroxyPROGESTERone Acetate 150 MG/ML SUSY Inject 1 mL (150 mg total) into the muscle every 3 (three) months.  [name] NEEDLE, DISP, 18 G 18G X 1" MISC To draw estradiol valerate  . NEEDLE, DISP, 25 G (BD DISP NEEDLES) 25G X 5/8" MISC To inject estradiol valerate weekly  . QUEtiapine (SEROQUEL) 100 MG tablet Take 0.5 tablets (50 mg total) by mouth at bedtime. After one week, can increase to 1 tablet (100 mg) po qhs  . sildenafil (VIAGRA) 100 MG tablet Take 0.5-1 tablets (50-100 mg total) by mouth daily as needed for erectile dysfunction.  . SYRINGE/NEEDLE, DISP, 1 ML (B-D SYRINGE/NEEDLE 1CC/25GX5/8) 25G X 5/8" 1 ML MISC For subcutaneous injection of estradiol  . tadalafil (CIALIS) 20 MG tablet Take 0.5-1 tablets (10-20 mg total) by mouth daily as needed for erectile dysfunction.    Allergies  Allergen Reactions  . Duloxetine Other (See Comments)    Sweating, anxiety  . Erythromycin Nausea And Vomiting and Other (See Comments)    Patient Active Problem List   Diagnosis Date Noted  . History of long-term use of multiple prescription drugs 09/25/2018  . Polysubstance dependence in early, early partial, sustained full, or sustained partial remission (HCC) 09/25/2018  . 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 Nicotine dependence with current use     Family History  Problem Relation Age of Onset  . Hypertension Father   . Hypertension Paternal Uncle   . Breast cancer Maternal Grandmother     Social History   Tobacco Use  Smoking Status Former Smoker  Smokeless Tobacco Never Used    Past Surgical History:  Procedure Laterality Date  . TONSILLECTOMY      Immunization History  Administered Date(s) Administered  . Moderna Sars-Covid-2 Vaccination 05/19/2019, 06/09/2019    No results found for this or any previous visit (from the past 2160  hour(s)).  No results found.     All questions at time of visit were answered - patient instructed to contact office with any additional concerns or updates. ER/RTC precautions were reviewed with the patient as applicable.   Please note: manual typing as well as voice recognition software may have been used to produce this document - typos may escape review. Please contact Dr. [phone] for any needed clarifications.   Total encounter time on date of service, 07/26/20, was 20 minutes spent addressing problems/issues as noted above in Assessment & Plan, including time spent in discussion with patient regarding the HPI, ROS, confirming history, reviewing Assessment & Plan, as well as time spent on coordination of care, record review.

## 2020-08-25 ENCOUNTER — Other Ambulatory Visit: Payer: Self-pay | Admitting: Osteopathic Medicine

## 2020-08-27 ENCOUNTER — Encounter: Payer: Self-pay | Admitting: Osteopathic Medicine

## 2020-08-27 ENCOUNTER — Other Ambulatory Visit: Payer: Self-pay

## 2020-08-27 ENCOUNTER — Ambulatory Visit (INDEPENDENT_AMBULATORY_CARE_PROVIDER_SITE_OTHER): Payer: Self-pay | Admitting: Osteopathic Medicine

## 2020-08-27 VITALS — BP 119/78 | HR 83 | Temp 97.8°F | Wt 190.1 lb

## 2020-08-27 DIAGNOSIS — M549 Dorsalgia, unspecified: Secondary | ICD-10-CM

## 2020-08-27 DIAGNOSIS — M79609 Pain in unspecified limb: Secondary | ICD-10-CM

## 2020-08-27 MED ORDER — KETOROLAC TROMETHAMINE 60 MG/2ML IM SOLN
60.0000 mg | Freq: Once | INTRAMUSCULAR | Status: AC
  Administered 2020-08-27: 60 mg via INTRAMUSCULAR

## 2020-08-27 MED ORDER — METHYLPREDNISOLONE SODIUM SUCC 125 MG IJ SOLR
125.0000 mg | Freq: Once | INTRAMUSCULAR | Status: AC
  Administered 2020-08-27: 125 mg via INTRAMUSCULAR

## 2020-08-27 NOTE — Progress Notes (Signed)
"Hunter" Lynch is a 40 y.o. adult transwoman who presents to  [facility] at [facility]  today, 08/27/20, seeking care for the following:  Back pain: ongoing about 15 years. Previously on opiate pain medications w/ history of addiction so she'd like to avoid those types of medications. Prednisone has helped in the past. Would like referral for specialist for back issues, will have insurance soon after she's married.     ASSESSMENT & PLAN with other pertinent findings:  The encounter diagnosis was Pain of back and lower extremity.    Patient Instructions  Plan:  Steroid shot (Solu-Medrol) and antiinflammatory (Toradol) shots today in office   See printed instructions for home exercises  Referral in for orthopedics - will send to someone who accepts [employer]      Orders Placed This Encounter  Procedures  . Ambulatory referral to Orthopedic Surgery    Meds ordered this encounter  Medications  . methylPREDNISolone sodium succinate (SOLU-MEDROL) 125 mg/2 mL injection 125 mg  . ketorolac (TORADOL) injection 60 mg     See below for relevant physical exam findings  See below for recent lab and imaging results reviewed  Medications, allergies, PMH, PSH, SocH, FamH reviewed below    Follow-up instructions: Return if symptoms worsen or fail to improve.                                        Exam:  BP 119/78 (BP Location: Left Arm, Patient Position: Sitting, Cuff Size: Normal)   Pulse 83   Temp 97.8 F (36.6 C) (Oral)   Wt 190 lb 1.9 oz (86.2 kg)   BMI 25.08 kg/m   Constitutional: VS see above. General Appearance: alert, well-developed, well-nourished, NAD  Neck: No masses, trachea midline.   Respiratory: Normal respiratory effort.   Musculoskeletal: Gait normal. Symmetric and independent movement of all extremities  Neurological: Normal balance/coordination. No tremor.  Skin: warm,  dry, intact.   Psychiatric: Normal judgment/insight. Normal mood and affect. Oriented x3.   Current Meds  Medication Sig  . B-D SYRINGE LUER-LOK 1CC 1 ML MISC USE 1 SYRINGE ONCE A WEEK FOR  SUBCUTANEOUS  OR  INTRAMUSCULAR  INJECTION  . buPROPion (WELLBUTRIN XL) 150 MG 24 hr tablet Take 1 tablet (150 mg total) by mouth every morning.  [name] estradiol valerate (DELESTROGEN) 20 MG/ML injection Inject 0.75 mLs (15 mg total) into the muscle once a week.  . medroxyPROGESTERone Acetate 150 MG/ML SUSY Inject 1 mL (150 mg total) into the muscle every 3 (three) months.  [name] NEEDLE, DISP, 18 G 18G X 1" MISC To draw estradiol valerate  . NEEDLE, DISP, 25 G (BD DISP NEEDLES) 25G X 5/8" MISC To inject estradiol valerate weekly  . QUEtiapine (SEROQUEL) 100 MG tablet Take 0.5 tablets (50 mg total) by mouth at bedtime. After one week, can increase to 1 tablet (100 mg) po qhs  . sildenafil (VIAGRA) 100 MG tablet Take 0.5-1 tablets (50-100 mg total) by mouth daily as needed for erectile dysfunction.  . SYRINGE/NEEDLE, DISP, 1 ML (B-D SYRINGE/NEEDLE 1CC/25GX5/8) 25G X 5/8" 1 ML MISC For subcutaneous injection of estradiol  . tadalafil (CIALIS) 20 MG tablet TAKE 1/2 TO 1 (ONE-HALF TO ONE) TABLET BY MOUTH ONCE DAILY AS NEEDED FOR ERECTILE DYSFUNCTION    Allergies  Allergen Reactions  . Duloxetine Other (See Comments)    Sweating, anxiety  .  Erythromycin Nausea And Vomiting and Other (See Comments)    Patient Active Problem List   Diagnosis Date Noted  . History of long-term use of multiple prescription drugs 09/25/2018  . Polysubstance dependence in early, early partial, sustained full, or sustained partial remission (HCC) 09/25/2018  . 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 Nicotine dependence with current use     Family History  Problem Relation Age of Onset  . Hypertension Father   . Hypertension Paternal Uncle   . Breast cancer Maternal Grandmother     Social History    Tobacco Use  Smoking Status Former Smoker  Smokeless Tobacco Never Used    Past Surgical History:  Procedure Laterality Date  . TONSILLECTOMY      Immunization History  Administered Date(s) Administered  . Moderna Sars-Covid-2 Vaccination 05/19/2019, 06/09/2019    No results found for this or any previous visit (from the past 2160 hour(s)).  No results found.     All questions at time of visit were answered - patient instructed to contact office with any additional concerns or updates. ER/RTC precautions were reviewed with the patient as applicable.   Please note: manual typing as well as voice recognition software may have been used to produce this document - typos may escape review. Please contact Dr. Lyn Hollingshead for any needed clarifications.   Total encounter time on date of service, 08/28/20, was 15 minutes spent addressing problems/issues as noted above in Assessment & Plan, including time spent in discussion with patient regarding the HPI, ROS, confirming history, reviewing Assessment & Plan, as well as time spent on coordination of care, record review.

## 2020-08-27 NOTE — Patient Instructions (Signed)
Plan:  Steroid shot (Solu-Medrol) and antiinflammatory (Toradol) shots today in office   See printed instructions for home exercises  Referral in for orthopedics - will send to someone who accepts [employer]

## 2020-09-03 ENCOUNTER — Other Ambulatory Visit: Payer: Self-pay | Admitting: Osteopathic Medicine

## 2020-09-18 ENCOUNTER — Telehealth: Payer: Self-pay

## 2020-09-18 DIAGNOSIS — IMO0001 Reserved for inherently not codable concepts without codable children: Secondary | ICD-10-CM

## 2020-09-18 MED ORDER — ESTRADIOL VALERATE 20 MG/ML IM OIL: 20.0000 mg | TOPICAL_OIL | INTRAMUSCULAR | 1 refills | Status: DC

## 2020-09-18 NOTE — Telephone Encounter (Signed)
[facility] pharmacy is requesting estradiol val. Per pharmacy, patient is requesting to administer 1 ml. Current rx is 0.75 ml. Pls send an updated rx to the pharmacy.

## 2020-10-01 ENCOUNTER — Emergency Department (HOSPITAL_COMMUNITY): Payer: No Typology Code available for payment source

## 2020-10-01 ENCOUNTER — Emergency Department (HOSPITAL_COMMUNITY)
Admission: EM | Admit: 2020-10-01 | Discharge: 2020-10-01 | Disposition: A | Discharge status: Home / Self Care | Payer: No Typology Code available for payment source | Attending: Emergency Medicine | Admitting: Emergency Medicine

## 2020-10-01 ENCOUNTER — Encounter (HOSPITAL_COMMUNITY): Payer: Self-pay | Admitting: Emergency Medicine

## 2020-10-01 DIAGNOSIS — S39012A Strain of muscle, fascia and tendon of lower back, initial encounter: Secondary | ICD-10-CM | POA: Insufficient documentation

## 2020-10-01 DIAGNOSIS — S8000XA Contusion of unspecified knee, initial encounter: Secondary | ICD-10-CM | POA: Diagnosis not present

## 2020-10-01 DIAGNOSIS — Z87891 Personal history of nicotine dependence: Secondary | ICD-10-CM | POA: Insufficient documentation

## 2020-10-01 DIAGNOSIS — Y92410 Unspecified street and highway as the place of occurrence of the external cause: Secondary | ICD-10-CM | POA: Diagnosis not present

## 2020-10-01 DIAGNOSIS — S161XXA Strain of muscle, fascia and tendon at neck level, initial encounter: Secondary | ICD-10-CM | POA: Diagnosis not present

## 2020-10-01 DIAGNOSIS — S0990XA Unspecified injury of head, initial encounter: Secondary | ICD-10-CM | POA: Diagnosis not present

## 2020-10-01 DIAGNOSIS — S199XXA Unspecified injury of neck, initial encounter: Secondary | ICD-10-CM | POA: Diagnosis present

## 2020-10-01 MED ORDER — CYCLOBENZAPRINE HCL 10 MG PO TABS: 10.0000 mg | ORAL_TABLET | Freq: Two times a day (BID) | ORAL | 0 refills | Status: DC | PRN

## 2020-10-01 MED ORDER — NAPROXEN 375 MG PO TABS: 375.0000 mg | ORAL_TABLET | Freq: Two times a day (BID) | ORAL | 0 refills | Status: DC
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
## 2020-10-01 NOTE — Discharge Instructions (Addendum)
Try applying ice to help with areas of soreness and pain.  Take the medications as needed for discomfort of muscle spasm.  Follow-up with your doctor to be rechecked if symptoms do not resolve within the next week
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
## 2020-10-02 ENCOUNTER — Telehealth: Payer: Self-pay | Admitting: General Practice

## 2020-10-02 NOTE — Telephone Encounter (Signed)
Transition Care Management Unsuccessful Follow-up Telephone Call  Date of discharge and from where:  10/01/20 from [facility]  Attempts:  1st Attempt  Reason for unsuccessful TCM follow-up call:  No answer/busy

## 2020-10-03 NOTE — Telephone Encounter (Signed)
Transition Care Management Unsuccessful Follow-up Telephone Call  Date of discharge and from where:  10/01/20 from [facility]  Attempts:  2nd Attempt  Reason for unsuccessful TCM follow-up call:  No answer/busy

## 2020-10-04 NOTE — Telephone Encounter (Signed)
Transition Care Management Unsuccessful Follow-up Telephone Call  Date of discharge and from where:  10/01/2020 from [facility]  Attempts:  3rd Attempt  Reason for unsuccessful TCM follow-up call:  Unable to reach patient

## 2021-01-13 ENCOUNTER — Other Ambulatory Visit: Payer: Self-pay

## 2021-01-13 DIAGNOSIS — Z789 Other specified health status: Secondary | ICD-10-CM

## 2021-01-13 MED ORDER — ESTRADIOL VALERATE 20 MG/ML IM OIL: 20.0000 mg | TOPICAL_OIL | INTRAMUSCULAR | 0 refills | Status: DC

## 2021-01-13 NOTE — Telephone Encounter (Signed)
One month supply sent to pharmacy. Patient will need to have an establish care appointment with a new PCP for further refills.

## 2021-01-13 NOTE — Telephone Encounter (Signed)
Left voicemail message for patient to call back to establish with a new provider for any further refills. AM

## 2021-01-13 NOTE — Telephone Encounter (Signed)
[facility] pharmacy requesting med refill for estradiol valerate 20 mg. Rx pended.

## 2021-01-23 ENCOUNTER — Other Ambulatory Visit: Payer: Self-pay

## 2021-01-23 DIAGNOSIS — Z789 Other specified health status: Secondary | ICD-10-CM

## 2021-01-23 MED ORDER — ESTRADIOL VALERATE 20 MG/ML IM OIL: 20.0000 mg | TOPICAL_OIL | INTRAMUSCULAR | 0 refills | Status: DC

## 2021-01-24 ENCOUNTER — Other Ambulatory Visit: Payer: Self-pay

## 2021-01-24 DIAGNOSIS — F649 Gender identity disorder, unspecified: Secondary | ICD-10-CM

## 2021-01-24 MED ORDER — BD SYRINGE LUER-LOK 1 ML MISC: 0 refills | Status: DC

## 2021-01-24 MED ORDER — "NEEDLE (DISP) 18G X 1"" MISC": 1 refills | Status: DC

## 2021-01-24 MED ORDER — "BD DISP NEEDLES 25G X 5/8"" MISC": 1 refills | Status: DC

## 2021-02-25 ENCOUNTER — Other Ambulatory Visit: Payer: Self-pay

## 2021-02-25 ENCOUNTER — Encounter: Payer: Self-pay | Admitting: Medical-Surgical

## 2021-02-25 ENCOUNTER — Ambulatory Visit (INDEPENDENT_AMBULATORY_CARE_PROVIDER_SITE_OTHER): Payer: Self-pay | Admitting: Medical-Surgical

## 2021-02-25 VITALS — BP 143/99 | HR 72 | Resp 20 | Ht 73.0 in | Wt 183.0 lb

## 2021-02-25 DIAGNOSIS — F332 Major depressive disorder, recurrent severe without psychotic features: Secondary | ICD-10-CM

## 2021-02-25 DIAGNOSIS — Z7689 Persons encountering health services in other specified circumstances: Secondary | ICD-10-CM

## 2021-02-25 DIAGNOSIS — F649 Gender identity disorder, unspecified: Secondary | ICD-10-CM

## 2021-02-25 DIAGNOSIS — F1921 Other psychoactive substance dependence, in remission: Secondary | ICD-10-CM

## 2021-02-25 DIAGNOSIS — N522 Drug-induced erectile dysfunction: Secondary | ICD-10-CM

## 2021-02-25 DIAGNOSIS — Z789 Other specified health status: Secondary | ICD-10-CM

## 2021-02-25 MED ORDER — NEEDLE (DISP) 18G X 1" MISC
1 refills | Status: AC
Start: 2021-02-25 — End: ?

## 2021-02-25 MED ORDER — ESTRADIOL VALERATE 20 MG/ML IM OIL: 30.0000 mg | TOPICAL_OIL | INTRAMUSCULAR | 0 refills | Status: DC

## 2021-02-25 MED ORDER — BD SYRINGE LUER-LOK 1 ML MISC: 0 refills | Status: DC

## 2021-02-25 MED ORDER — SILDENAFIL CITRATE 100 MG PO TABS: 50.0000 mg | ORAL_TABLET | Freq: Every day | ORAL | 0 refills | Status: DC | PRN

## 2021-02-25 MED ORDER — MEDROXYPROGESTERONE ACETATE 150 MG/ML IM SUSY: 150.0000 mg | PREFILLED_SYRINGE | INTRAMUSCULAR | 5 refills | Status: DC

## 2021-02-25 MED ORDER — "BD DISP NEEDLES 25G X 5/8"" MISC": 1 refills | Status: AC

## 2021-02-25 NOTE — Progress Notes (Signed)
  HPI with pertinent ROS:   CC: Transfer of care, transgender person  HPI: Pleasant 40 year old male to male transgender patient presenting today to transfer care to a new PCP.  Has been on hormone replacement therapy for many years but has been consistently taking estradiol and Depo-Provera as prescribed for the last 3 years.  Currently taking estradiol valerate 30 mg weekly.  Notes that she has periods where she needs more followed by periods that she needs less.  Notes this is a somewhat cyclical occurrence and happens about every 6 months that she needs a dose change.  Doing injections at home without difficulty and would like to have refills on medications as well as needles and syringes.  Has not been able to see a mental health therapist for several years.  Does have a history of drug addiction which she has gotten clean from.  She is doing well overall but notes significant mood swings and wonders if there may be some bipolar disorder involved.  Previously treated with Wellbutrin but this made her very forgetful.  Was prescribed Seroquel to take at night for mood stabilization but decided not to try this.  She is not sure where she would like to go with this at this point.  Reports getting insurance in January which will be extremely helpful.  That point she would like to have a referral to psychology as well as counseling and psychiatry for further management.  No labs over the last couple of years so we will plan to check labs once insurance is active in January to evaluate estrogen and testosterone levels.  I reviewed the past medical history, family history, social history, surgical history, and allergies today and no changes were needed.  Please see the problem list section below in epic for further details.   Physical exam:   General: Well Developed, well nourished, and in no acute distress.  Neuro: Alert and oriented x3.  HEENT: Normocephalic, atraumatic.  Skin: Warm and  dry. Cardiac: Regular rate and rhythm.  Respiratory: Not using accessory muscles, speaking in full sentences.  Impression and Recommendations:    1. Encounter to establish care Reviewed available information and discussed care concerns with patient.   2. Male-to-male transgender person Unfortunately we cannot go any higher on the estradiol valerate.  Discussed maximum recommended dosage of 30 mg every 2 weeks rather than once weekly.  She has been prescribed this level of estrogen replacement before so we will continue this for now and then plan to recheck labs in January.  Continuing medroxyprogesterone injections every 3 months.  Refills sent to pharmacy as requested.  3. Severe episode of recurrent major depressive disorder, without psychotic features (HCC) 4. Drug addiction in remission (HCC) 5.  Gender dysphoria She will let me know once her insurance is active and we will go ahead with placing referrals to psychiatry, psychology, and behavioral health.  Holding off on further medications at this point per patient preference.  6.  Erectile dysfunction Using sildenafil 100 mg daily as needed.  Notes this works well so we will go ahead and continue this.  Refill sent to pharmacy.  Return in about 3 months (around 05/26/2021) for HRT/mood follow up. ___________________________________________ Thayer Ohm, DNP, APRN, FNP-BC Primary Care and Sports Medicine [facility]

## 2021-03-30 ENCOUNTER — Other Ambulatory Visit: Payer: Self-pay | Admitting: Family Medicine

## 2021-03-30 DIAGNOSIS — F649 Gender identity disorder, unspecified: Secondary | ICD-10-CM

## 2021-04-02 ENCOUNTER — Encounter: Payer: Self-pay | Admitting: Medical-Surgical

## 2021-04-02 ENCOUNTER — Other Ambulatory Visit: Payer: Self-pay

## 2021-04-02 ENCOUNTER — Telehealth (INDEPENDENT_AMBULATORY_CARE_PROVIDER_SITE_OTHER): Payer: Self-pay | Admitting: Medical-Surgical

## 2021-04-02 DIAGNOSIS — J40 Bronchitis, not specified as acute or chronic: Secondary | ICD-10-CM

## 2021-04-02 DIAGNOSIS — J329 Chronic sinusitis, unspecified: Secondary | ICD-10-CM

## 2021-04-02 MED ORDER — DOXYCYCLINE HYCLATE 100 MG PO TABS: 100.0000 mg | ORAL_TABLET | Freq: Two times a day (BID) | ORAL | 0 refills | Status: AC

## 2021-04-02 MED ORDER — AZELASTINE HCL 0.1 % NA SOLN: 2.0000 | Freq: Two times a day (BID) | NASAL | 1 refills | Status: DC

## 2021-04-02 MED ORDER — GUAIFENESIN-CODEINE 100-10 MG/5ML PO SOLN: 5.0000 mL | Freq: Three times a day (TID) | ORAL | 0 refills | Status: DC | PRN

## 2021-04-02 MED ORDER — BENZONATATE 200 MG PO CAPS: 200.0000 mg | ORAL_CAPSULE | Freq: Three times a day (TID) | ORAL | 0 refills | Status: DC | PRN

## 2021-04-02 NOTE — Progress Notes (Signed)
About a month Covid negative Cough, congestion, fatigued

## 2021-04-02 NOTE — Progress Notes (Signed)
Virtual Visit via Telephone   I connected with  Hunter Lynch  on 04/02/21 by telephone/telehealth and verified that I am speaking with the correct person using two identifiers.   I discussed the limitations, risks, security and privacy concerns of performing an evaluation and management service by telephone, including the higher likelihood of inaccurate diagnosis and treatment, and the availability of in person appointments.  We also discussed the likely need of an additional face to face encounter for complete and high quality delivery of care.  I also discussed with the patient that there may be a patient responsible charge related to this service. The patient expressed understanding and wishes to proceed.  Provider location is in medical facility. Patient location is at their home, different from provider location. People involved in care of the patient during this telehealth encounter were myself, my nurse/medical assistant, and my front office/scheduling team member.  CC: Upper respiratory symptoms, cough  HPI: Pleasant 41 year old male to male transgender patient presenting via telephone visit per her preference to discuss current upper respiratory symptoms with cough.  Notes that she has been sick for the last several months but her symptoms have gotten better than worse periodically.  Over the last month or so, she has continued to have significant cough with sinus and chest congestion, postnasal drip, rhinorrhea, fatigue, sore throat, and lymphadenopathy.  Has tested for COVID with negative results.  Denies fever, chills, and unusual GI symptoms.  Has tried using over-the-counter remedies without relief.  Review of Systems: See HPI for pertinent positives and negatives.   Objective Findings:    General: Speaking full sentences, no audible heavy breathing.  Sounds alert and appropriately interactive.    Impression and Recommendations:    1. Sinobronchitis Symptoms have been  lingering with progressive worsening over the last month.  Treating with doxycycline 100 mg twice daily x7 days.  Sending in Astelin nasal spray to help with rhinorrhea and postnasal drip.  We will use Tessalon Perles as well as Robitussin-AC to help with cough.  Discussed possible prednisone taper but she would like to hold off on this for now due to considerable side effects.  Continue conservative measures at home.  I discussed the above assessment and treatment plan with the patient. The patient was provided an opportunity to ask questions and all were answered. The patient agreed with the plan and demonstrated an understanding of the instructions.   The patient was advised to call back or seek an in-person evaluation if the symptoms worsen or if the condition fails to improve as anticipated.  20 minutes of non-face-to-face time was provided during this encounter.  Return if symptoms worsen or fail to improve. ___________________________________________ Christen Butter, DNP, APRN, FNP-BC Primary Care and Sports Medicine [facility] [place]

## 2021-04-18 ENCOUNTER — Telehealth: Payer: Self-pay | Admitting: Medical-Surgical

## 2021-04-18 NOTE — Telephone Encounter (Signed)
PT called and states that now that they have finished with their antibiotic for the sinus infection they now have a UTI. Wants to know if they need to come in or can a RX get prescribed.

## 2021-04-18 NOTE — Telephone Encounter (Signed)
We do not want to blindly give another abx. Will need urine checked. If they are having yeast infection like symptoms could give diflucan.

## 2021-04-18 NOTE — Telephone Encounter (Signed)
This is a Hunter Lynch patient, please schedule follow up with Hunter Lynch to evaluate UTI symptoms so we check urine.

## 2021-04-21 NOTE — Telephone Encounter (Signed)
LVM for patient to call back. ?

## 2021-04-23 ENCOUNTER — Ambulatory Visit: Payer: 59 | Admitting: Medical-Surgical

## 2021-04-23 ENCOUNTER — Encounter: Payer: Self-pay | Admitting: Medical-Surgical

## 2021-04-23 ENCOUNTER — Other Ambulatory Visit: Payer: Self-pay

## 2021-04-23 VITALS — BP 114/78 | HR 67 | Temp 97.5°F | Resp 20 | Ht 73.0 in | Wt 183.8 lb

## 2021-04-23 DIAGNOSIS — Z789 Other specified health status: Secondary | ICD-10-CM | POA: Diagnosis not present

## 2021-04-23 DIAGNOSIS — Z0000 Encounter for general adult medical examination without abnormal findings: Secondary | ICD-10-CM

## 2021-04-23 DIAGNOSIS — Z113 Encounter for screening for infections with a predominantly sexual mode of transmission: Secondary | ICD-10-CM | POA: Diagnosis not present

## 2021-04-23 DIAGNOSIS — R5383 Other fatigue: Secondary | ICD-10-CM

## 2021-04-23 DIAGNOSIS — Z131 Encounter for screening for diabetes mellitus: Secondary | ICD-10-CM

## 2021-04-23 DIAGNOSIS — F649 Gender identity disorder, unspecified: Secondary | ICD-10-CM

## 2021-04-23 DIAGNOSIS — N3001 Acute cystitis with hematuria: Secondary | ICD-10-CM

## 2021-04-23 DIAGNOSIS — R300 Dysuria: Secondary | ICD-10-CM

## 2021-04-23 DIAGNOSIS — Z1329 Encounter for screening for other suspected endocrine disorder: Secondary | ICD-10-CM

## 2021-04-23 LAB — POCT URINALYSIS DIPSTICK
Bilirubin, UA: NEGATIVE
Glucose, UA: POSITIVE — AB
Ketones, UA: NEGATIVE
Nitrite, UA: POSITIVE
Odor: NORMAL
Protein, UA: POSITIVE — AB
Spec Grav, UA: <=1.005 — AB (ref 1.010–1.025)
Urobilinogen, UA: 2 E.U./dL — AB
pH, UA: 5 (ref 5.0–8.0)

## 2021-04-23 MED ORDER — NITROFURANTOIN MONOHYD MACRO 100 MG PO CAPS: 100.0000 mg | ORAL_CAPSULE | Freq: Two times a day (BID) | ORAL | 0 refills | Status: DC

## 2021-04-23 NOTE — Progress Notes (Signed)
°  HPI with pertinent ROS:   CC: possible UTI  HPI: Pleasant 41 year old transgender male presenting today for 1 week of urinary symptoms including burning, pain with urination, urgency, frequency, and leaking of urine.  Has not noted any blood in the urine however has been taking Azo which has caused discoloration.  Staying extremely well-hydrated.  Having some right flank/low back pain.  Denies fevers, nausea, vomiting, and passing any stones.  No previous history of kidney stones and no recurrent UTIs.  Is currently sexually active in a monogamous relationship with a male partner but has not participated in insertive anal intercourse in greater than 1 year.  Does get regularly tested for STIs but would like to have this to blood work today.  Having significant issues with fatigue and would like to have labs checked to evaluate.  Also notes a predominantly vegan/vegetarian diet and is worried about iron as well as B12 levels that may be contributing to fatigue.  Currently taking estrogen injections as well as the medroxyprogesterone injections.  Due for labs to follow-up on hormone therapy.  I reviewed the past medical history, family history, social history, surgical history, and allergies today and no changes were needed.  Please see the problem list section below in epic for further details.   Physical exam:   General: Well Developed, well nourished, and in no acute distress.  Neuro: Alert and oriented x3.  HEENT: Normocephalic, atraumatic.  Skin: Warm and dry. Cardiac: Regular rate and rhythm, no murmurs rubs or gallops, no lower extremity edema.  Respiratory: Clear to auscultation bilaterally. Not using accessory muscles, speaking in full sentences.  Impression and Recommendations:    1. Dysuria POCT urinalysis positive for large leukocytes, nitrites, small blood, glucose, protein, and elevated urobilinogen.  Sending for culture. - POCT Urinalysis Dipstick - Urine Culture  2.  Acute cystitis with hematuria Treating with Macrobid 100 mg twice daily x5 days.  If culture comes back showing resistance, advised patient we may need to switch over to a different antibiotic.  Patient verbalized understanding and is agreeable to the plan.  3. Routine screening for STI (sexually transmitted infection) STI testing as below per patient request. - HIV Antibody (routine testing w rflx) - Hepatitis B surface antigen - RPR - Hepatitis C Antibody - C. trachomatis/N. gonorrhoeae RNA  4. Male-to-male transgender person 5. Gender dysphoria Checking testosterone, estradiol, and lipid panel today. - Testosterone - Estradiol - Lipid panel  6. Fatigue, unspecified type Checking labs as below. - CBC with Differential - COMPLETE METABOLIC PANEL WITH GFR - Vitamin B12 - Fe+TIBC+Fer  7. Healthcare maintenance Checking labs. - Lipid panel - CBC with Differential - COMPLETE METABOLIC PANEL WITH GFR  8. Thyroid disorder screen -TSH. - TSH  9. Diabetes mellitus screening Checking hemoglobin A1c. - Hemoglobin A1c  Return in about 6 months (around 10/21/2021) for hormone management follow up. ___________________________________________ Clearnce Sorrel, DNP, APRN, FNP-BC Primary Care and [place]

## 2021-04-24 LAB — LIPID PANEL
Cholesterol: 169 mg/dL (ref <200)
HDL: 64 mg/dL (ref >=40)
LDL Cholesterol (Calc): 85 mg/dL (calc)
Non-HDL Cholesterol (Calc): 105 mg/dL (calc) (ref <130)
Total CHOL/HDL Ratio: 2.6 (calc) (ref <5.0)
Triglycerides: 104 mg/dL (ref <150)

## 2021-04-24 LAB — HEMOGLOBIN A1C
Hgb A1c MFr Bld: 4.8 % of total Hgb (ref <5.7)
Mean Plasma Glucose: 91 mg/dL
eAG (mmol/L): 5 mmol/L

## 2021-04-24 LAB — TESTOSTERONE: Testosterone: 14 ng/dL — ABNORMAL LOW (ref 250–827)

## 2021-04-24 LAB — COMPLETE METABOLIC PANEL WITH GFR
AG Ratio: 1.6 (calc) (ref 1.0–2.5)
ALT: 13 U/L (ref 9–46)
AST: 15 U/L (ref 10–40)
Albumin: 4.3 g/dL (ref 3.6–5.1)
Alkaline phosphatase (APISO): 38 U/L (ref 36–130)
BUN: 11 mg/dL (ref 7–25)
CO2: 27 mmol/L (ref 20–32)
Calcium: 9.3 mg/dL (ref 8.6–10.3)
Chloride: 105 mmol/L (ref 98–110)
Creat: 0.72 mg/dL (ref 0.60–1.26)
Globulin: 2.7 g/dL (calc) (ref 1.9–3.7)
Glucose, Bld: 73 mg/dL (ref 65–99)
Potassium: 4.6 mmol/L (ref 3.5–5.3)
Sodium: 139 mmol/L (ref 135–146)
Total Bilirubin: 0.6 mg/dL (ref 0.2–1.2)
Total Protein: 7 g/dL (ref 6.1–8.1)
eGFR: 119 mL/min/{1.73_m2} (ref >=60)

## 2021-04-24 LAB — HEPATITIS B SURFACE ANTIGEN: Hepatitis B Surface Ag: NONREACTIVE

## 2021-04-24 LAB — CBC WITH DIFFERENTIAL/PLATELET
Absolute Monocytes: 553 cells/uL (ref 200–950)
Basophils Absolute: 60 cells/uL (ref 0–200)
Basophils Relative: 0.7 %
Eosinophils Absolute: 136 cells/uL (ref 15–500)
Eosinophils Relative: 1.6 %
HCT: 46.3 % (ref 38.5–50.0)
Hemoglobin: 15.6 g/dL (ref 13.2–17.1)
Lymphs Abs: 1615 cells/uL (ref 850–3900)
MCH: 31.1 pg (ref 27.0–33.0)
MCHC: 33.7 g/dL (ref 32.0–36.0)
MCV: 92.2 fL (ref 80.0–100.0)
MPV: 10.4 fL (ref 7.5–12.5)
Monocytes Relative: 6.5 %
Neutro Abs: 6137 cells/uL (ref 1500–7800)
Neutrophils Relative %: 72.2 %
Platelets: 337 10*3/uL (ref 140–400)
RBC: 5.02 10*6/uL (ref 4.20–5.80)
RDW: 12 % (ref 11.0–15.0)
Total Lymphocyte: 19 %
WBC: 8.5 10*3/uL (ref 3.8–10.8)

## 2021-04-24 LAB — IRON,TIBC AND FERRITIN PANEL
%SAT: 42 % (calc) (ref 20–48)
Ferritin: 89 ng/mL (ref 38–380)
Iron: 140 ug/dL (ref 50–180)
TIBC: 337 mcg/dL (calc) (ref 250–425)

## 2021-04-24 LAB — TSH: TSH: 2.04 mIU/L (ref 0.40–4.50)

## 2021-04-24 LAB — VITAMIN B12: Vitamin B-12: 353 pg/mL (ref 200–1100)

## 2021-04-24 LAB — HEPATITIS C ANTIBODY
Hepatitis C Ab: NONREACTIVE
SIGNAL TO CUT-OFF: 0.03 (ref <1.00)

## 2021-04-24 LAB — RPR: RPR Ser Ql: NONREACTIVE

## 2021-04-24 LAB — HIV ANTIBODY (ROUTINE TESTING W REFLEX): HIV 1&2 Ab, 4th Generation: NONREACTIVE

## 2021-04-24 LAB — ESTRADIOL: Estradiol: 452 pg/mL — ABNORMAL HIGH (ref <=39)

## 2021-04-25 ENCOUNTER — Encounter: Payer: Self-pay | Admitting: Medical-Surgical

## 2021-04-25 LAB — URINE CULTURE
MICRO NUMBER:: 12953370
Result:: NO GROWTH
SPECIMEN QUALITY:: ADEQUATE

## 2021-04-25 LAB — C. TRACHOMATIS/N. GONORRHOEAE RNA
C. trachomatis RNA, TMA: NOT DETECTED
N. gonorrhoeae RNA, TMA: NOT DETECTED

## 2021-04-29 ENCOUNTER — Telehealth (INDEPENDENT_AMBULATORY_CARE_PROVIDER_SITE_OTHER): Payer: 59 | Admitting: Sports Medicine

## 2021-04-29 DIAGNOSIS — J310 Chronic rhinitis: Secondary | ICD-10-CM | POA: Diagnosis not present

## 2021-04-29 MED ORDER — IPRATROPIUM BROMIDE 0.06 % NA SOLN: 2.0000 | Freq: Four times a day (QID) | NASAL | 11 refills | Status: DC

## 2021-04-29 MED ORDER — MONTELUKAST SODIUM 10 MG PO TABS: 10.0000 mg | ORAL_TABLET | Freq: Every day | ORAL | 3 refills | Status: DC

## 2021-04-29 MED ORDER — FLUTICASONE PROPIONATE 50 MCG/ACT NA SUSP: NASAL | 3 refills | Status: DC

## 2021-04-29 MED ORDER — LEVOCETIRIZINE DIHYDROCHLORIDE 5 MG PO TABS: 5.0000 mg | ORAL_TABLET | Freq: Every evening | ORAL | Status: DC

## 2021-04-29 NOTE — Progress Notes (Signed)
° °  Virtual Visit via Telephone   I connected with  Hunter Lynch  on 04/29/21 by telephone/telehealth and verified that I am speaking with the correct person using two identifiers.   I discussed the limitations, risks, security and privacy concerns of performing an evaluation and management service by telephone, including the higher likelihood of inaccurate diagnosis and treatment, and the availability of in person appointments.  We also discussed the likely need of an additional face to face encounter for complete and high quality delivery of care.  I also discussed with the patient that there may be a patient responsible charge related to this service. The patient expressed understanding and wishes to proceed.  Provider location is in medical facility. Patient location is at their home, different from provider location. People involved in care of the patient during this telehealth encounter were myself, my nurse/medical assistant, and my front office/scheduling team member.  Review of Systems: No fevers, chills, night sweats, weight loss, chest pain, or shortness of breath.   Objective Findings:    General: Speaking full sentences, no audible heavy breathing.  Sounds alert and appropriately interactive.    Independent interpretation of tests performed by another provider:   None.  Brief History, Exam, Impression, and Recommendations:    Perennial non-allergic rhinitis This is a pleasant 41 year old male to male transgender, history of intranasal cocaine and methamphetamine use, with a chronically runny nose. She has been describing this as a sinusitis and I think she is got multiple courses of antibiotics without significant or long-lasting improvement. She has also used some oral nasal decongestants in the past, the dextromethorphan component tends to make her feel somewhat doped up. Not using a lot of Afrin. Symptoms are perennial, and not significantly changed by the  seasons. She is also used Flonase which is minimally beneficial, and has also used nasal Astelin which has not been that helpful. I think we have been approaching this incorrectly, and treating it as a sinusitis over and over when it is more of a perennial rhinitis. We will add nasal Atrovent, continue Flonase, discontinue Astelin, also adding Xyzal, Singulair. We have also discussed avoiding nasal vasoconstrictor such as Afrin, cocaine, methamphetamine. I would like to do all of the above for a solid month followed by an in person visit for physical examination, if nothing works we will have ENT weigh in.   I discussed the above assessment and treatment plan with the patient. The patient was provided an opportunity to ask questions and all were answered. The patient agreed with the plan and demonstrated an understanding of the instructions.   The patient was advised to call back or seek an in-person evaluation if the symptoms worsen or if the condition fails to improve as anticipated.   I provided 30 minutes of verbal and non-verbal time during this encounter date, time was needed to gather information, review chart, records, communicate/coordinate with staff remotely, as well as complete documentation.   ___________________________________________ Gwen Her. Dianah Field, M.D., ABFM., CAQSM. Primary Care and Sports Medicine [facility]  Adjunct Professor of [employer]

## 2021-04-29 NOTE — Assessment & Plan Note (Signed)
This is a pleasant 41 year old male to male transgender, history of intranasal cocaine and methamphetamine use, with a chronically runny nose. She has been describing this as a sinusitis and I think she is got multiple courses of antibiotics without significant or long-lasting improvement. She has also used some oral nasal decongestants in the past, the dextromethorphan component tends to make her feel somewhat doped up. Not using a lot of Afrin. Symptoms are perennial, and not significantly changed by the seasons. She is also used Flonase which is minimally beneficial, and has also used nasal Astelin which has not been that helpful. I think we have been approaching this incorrectly, and treating it as a sinusitis over and over when it is more of a perennial rhinitis. We will add nasal Atrovent, continue Flonase, discontinue Astelin, also adding Xyzal, Singulair. We have also discussed avoiding nasal vasoconstrictor such as Afrin, cocaine, methamphetamine. I would like to do all of the above for a solid month followed by an in person visit for physical examination, if nothing works we will have ENT weigh in.
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
## 2021-08-21 DIAGNOSIS — F332 Major depressive disorder, recurrent severe without psychotic features: Secondary | ICD-10-CM | POA: Insufficient documentation

## 2021-08-21 NOTE — Progress Notes (Unsigned)
   Established Patient Office Visit  Subjective   Patient ID: Hunter Lynch, transgender male   DOB: 1980/10/20 Age: 41 y.o. MRN: [id number]   No chief complaint on file.   HPI Hunter Lynch 41 year old transgender male presenting today to discuss a possible referral to psychiatry  ROS    Objective:    There were no vitals filed for this visit.   Physical Exam    No results found for this or any previous visit (from the past 24 hour(s)).   {Labs (Optional):23779}  The 10-year ASCVD risk score (Arnett DK, et al., 2019) is: 1.5%   Values used to calculate the score:     Age: 3 years     Sex: Male     Is Non-Hispanic African American: No     Diabetic: No     Tobacco smoker: Yes     Systolic Blood Pressure: 114 mmHg     Is BP treated: No     HDL Cholesterol: 64 mg/dL     Total Cholesterol: 169 mg/dL   Assessment & Plan:   No problem-specific Assessment & Plan notes found for this encounter.   No follow-ups on file.  ___________________________________________ Thayer Ohm, DNP, APRN, FNP-BC Primary Care and Sports Medicine [facility] [place]

## 2021-08-22 ENCOUNTER — Ambulatory Visit: Payer: 59 | Admitting: Medical-Surgical

## 2021-08-22 ENCOUNTER — Encounter: Payer: Self-pay | Admitting: Medical-Surgical

## 2021-08-22 VITALS — BP 142/89 | HR 82 | Resp 20 | Ht 73.0 in | Wt 179.0 lb

## 2021-08-22 DIAGNOSIS — F1921 Other psychoactive substance dependence, in remission: Secondary | ICD-10-CM | POA: Diagnosis not present

## 2021-08-22 DIAGNOSIS — Z789 Other specified health status: Secondary | ICD-10-CM

## 2021-08-22 DIAGNOSIS — F332 Major depressive disorder, recurrent severe without psychotic features: Secondary | ICD-10-CM

## 2021-08-22 DIAGNOSIS — R21 Rash and other nonspecific skin eruption: Secondary | ICD-10-CM | POA: Diagnosis not present

## 2021-08-22 DIAGNOSIS — F649 Gender identity disorder, unspecified: Secondary | ICD-10-CM

## 2021-08-22 DIAGNOSIS — R030 Elevated blood-pressure reading, without diagnosis of hypertension: Secondary | ICD-10-CM | POA: Insufficient documentation

## 2021-08-22 MED ORDER — CLOTRIMAZOLE-BETAMETHASONE 1-0.05 % EX CREA: 1.0000 "application " | TOPICAL_CREAM | Freq: Every day | CUTANEOUS | 0 refills | Status: DC

## 2021-08-22 NOTE — Assessment & Plan Note (Signed)
Discussed the length of time on hormones and care received here in our office.  I will draft a letter and send it to her via MyChart for her surgeon.

## 2021-08-22 NOTE — Assessment & Plan Note (Signed)
Unclear etiology.  Appears to be related to emotional state however cannot rule out potential allergic reaction to an unknown agent.  Sending in Lotrisone cream as this has helped in the past.  Recommend not using this for more than 14 days in a row to prevent skin changes.  Referring to allergy for further evaluation.

## 2021-08-22 NOTE — Assessment & Plan Note (Signed)
Referring to psychiatry. 

## 2021-08-22 NOTE — Assessment & Plan Note (Signed)
Discussed various options for medications that may help with weight gain.  Would like to stay away from any psychiatric medications at this point since she will be seeing psychiatry soon.  Briefly discussed Megace but unsure if this will have any effect on current HRT and is already on the medroxyprogesterone every 3 months.  We will look further into this.  In the meantime, recommend increasing portion sizes by one third if at all possible and continuing to aim for healthy foods.  Would like to see a 500-calorie surplus each day to gain approximately 1 pound per week.

## 2021-08-22 NOTE — Assessment & Plan Note (Signed)
Referring to psychiatry per patient request

## 2021-08-22 NOTE — Assessment & Plan Note (Signed)
Blood pressure has been elevated here in the office several times and suspect there is a component of whitecoat hypertension.  Recommend getting a home monitor to check blood pressures several times weekly.  If blood pressure is consistently running 130/80 or higher, we may need to discuss treatment for hypertension.  Recommend a low-sodium diet and regular intentional exercise.

## 2021-08-25 MED ORDER — FINASTERIDE 1 MG PO TABS: 1.0000 mg | ORAL_TABLET | Freq: Every day | ORAL | 3 refills | Status: DC

## 2021-08-26 ENCOUNTER — Encounter: Payer: Self-pay | Admitting: Medical-Surgical

## 2021-10-02 ENCOUNTER — Encounter: Payer: Self-pay | Admitting: Medical-Surgical

## 2021-10-02 ENCOUNTER — Ambulatory Visit: Payer: 59 | Admitting: Medical-Surgical

## 2021-10-02 VITALS — BP 159/94 | HR 77 | Ht 72.0 in | Wt 183.0 lb

## 2021-10-02 DIAGNOSIS — Z789 Other specified health status: Secondary | ICD-10-CM | POA: Diagnosis not present

## 2021-10-02 DIAGNOSIS — R030 Elevated blood-pressure reading, without diagnosis of hypertension: Secondary | ICD-10-CM

## 2021-10-02 DIAGNOSIS — L659 Nonscarring hair loss, unspecified: Secondary | ICD-10-CM | POA: Insufficient documentation

## 2021-10-02 MED ORDER — METOPROLOL SUCCINATE ER 25 MG PO TB24: 25.0000 mg | ORAL_TABLET | Freq: Every day | ORAL | 1 refills | Status: DC

## 2021-10-02 MED ORDER — ESTRADIOL VALERATE 20 MG/ML IM OIL: 30.0000 mg | TOPICAL_OIL | INTRAMUSCULAR | 0 refills | Status: DC

## 2021-10-02 NOTE — Assessment & Plan Note (Signed)
BP elevated on arrival and a bit better on recheck but still borderline.  Discussed medication options. Adding Toprol-XL 25mg  daily in hopes that the beta blocker will help some with anxiety.  Low sodium diet and regular intentional exercise recommended.

## 2021-10-02 NOTE — Progress Notes (Signed)
Established Patient Office Visit  Subjective   Patient ID: Hunter Lynch, transgender male   DOB: 01-12-1981 Age: 41 y.o. MRN: [id number]   Chief Complaint  Patient presents with   Follow-up    HPI Pleasant 41 year old transgender male presenting today for the following:  Hair loss: was started on finasteride 1mg  daily a few weeks ago. Took the medication as prescribed but experienced sexual side effects and was unable to tolerate the medication. Self discontinued and not interested in trying anything else for now.   BP elevation: noticed that her BP has been running high lately and is worried that her upcoming surgeries will be delayed if not in a good range. Admits that she is an anxious person which likely doesn't help with BP.   Requesting a refill on estradiol. Notes the pharmacy reported that the refill had been denied by our office but no information on file to show it was requested from . UTD on labs.    Objective:    Vitals:   10/02/21 1546  BP: (!) 159/94  Pulse: 77  Height: 6' (1.829 m)  Weight: 183 lb (83 kg)  SpO2: 99%  BMI (Calculated): 24.81   Physical Exam Vitals reviewed.  Constitutional:      General: She is not in acute distress.    Appearance: Normal appearance. She is not ill-appearing.  HENT:     Head: Normocephalic.  Cardiovascular:     Rate and Rhythm: Normal rate.     Pulses: Normal pulses.     Heart sounds: Normal heart sounds. No murmur heard.    No friction rub. No gallop.  Pulmonary:     Effort: Pulmonary effort is normal. No respiratory distress.     Breath sounds: Normal breath sounds.  Skin:    General: Skin is warm and dry.  Neurological:     Mental Status: She is alert and oriented to person, place, and time.  Psychiatric:        Mood and Affect: Mood normal.        Behavior: Behavior normal.        Thought Content: Thought content normal.        Judgment: Judgment normal.   No results found for this or any previous  visit (from the past 24 hour(s)).     The 10-year ASCVD risk score (Arnett DK, et al., 2019) is: 2.7%   Values used to calculate the score:     Age: 45 years     Sex: Male     Is Non-Hispanic African American: No     Diabetic: No     Tobacco smoker: Yes     Systolic Blood Pressure: 159 mmHg     Is BP treated: No     HDL Cholesterol: 64 mg/dL     Total Cholesterol: 169 mg/dL   Assessment & Plan:   Problem List Items Addressed This Visit       Other   Elevated blood pressure reading - Primary    BP elevated on arrival and a bit better on recheck but still borderline.  Discussed medication options. Adding Toprol-XL 25mg  daily in hopes that the beta blocker will help some with anxiety.  Low sodium diet and regular intentional exercise recommended.       Hair loss    Discontinue Finasteride.  Consider topical minoxidil.  Ensure adequate nutrition.  Consider adding a MVI or biotin supplement.       Male-to-male transgender person  Refilling estradiol as requested.       Relevant Medications   estradiol valerate (DELESTROGEN) 20 MG/ML injection   Follow up: Will plan to touch base in a couple of weeks via MyChart to see how BP is doing.   ___________________________________________ Thayer Ohm, DNP, APRN, FNP-BC Primary Care and Sports Medicine [facility] [place]

## 2021-10-02 NOTE — Assessment & Plan Note (Signed)
Discontinue Finasteride.  Consider topical minoxidil.  Ensure adequate nutrition.  Consider adding a MVI or biotin supplement.

## 2021-10-02 NOTE — Assessment & Plan Note (Signed)
Refilling estradiol as requested.

## 2021-10-06 ENCOUNTER — Encounter: Payer: Self-pay | Admitting: Medical-Surgical

## 2021-10-27 ENCOUNTER — Ambulatory Visit: Payer: 59 | Admitting: Physician Assistant

## 2021-10-27 VITALS — BP 124/85 | HR 82 | Ht 72.0 in | Wt 188.0 lb

## 2021-10-27 DIAGNOSIS — Z4802 Encounter for removal of sutures: Secondary | ICD-10-CM | POA: Diagnosis not present

## 2021-10-27 NOTE — Patient Instructions (Signed)
Suture Removal, Care After The following information offers guidance on how to care for yourself after your procedure. Your health care provider may also give you more specific instructions. If you have problems or questions, contact your health care provider. What can I expect after the procedure? After your stitches (sutures) are removed, it is common to have: Some discomfort and swelling in the area. Slight redness in the area. Follow these instructions at home: If you have a dressing: Wash your hands with soap and water for at least 20 seconds before and after you change your bandage (dressing). If soap and water are not available, use hand sanitizer. Change your dressing as told by your health care provider. If your dressing becomes wet or dirty, or develops a bad smell, change it as soon as possible. If your dressing sticks to your skin, pour warm, clean water over it until it loosens and can be removed without pulling apart the wound edges. Pat the area dry with a soft, clean towel. Do not rub the wound because that may cause bleeding. Wound care  Check your wound every day for signs of infection. Check for: More redness, swelling, or pain. Fluid or blood. New warmth, a rash, or hardness at the wound site. Pus or a bad smell. Wash your hands with soap and water for at least 20 seconds before and after touching your wound. If soap and water are not available, use hand sanitizer. Keep the wound area dry and clean. Clean and pat the wound dry as told by your health care provider. Apply cream or ointment only as told by your health care provider. If skin glue or adhesive strips were applied after sutures were removed, leave these closures in place. They may need to stay in place for 2 weeks or longer. If adhesive strip edges start to loosen and curl up, you may trim the loose edges. Do not remove adhesive strips completely unless your health care provider tells you to do that. Continue to  protect the wound from injury. Do not pick at your wound. Picking can cause an infection. Bathing Do not take baths, swim, or use a hot tub until your health care provider approves. Ask your health care provider if you may take showers. Follow these steps for showering: If you have a dressing, remove it before getting into the shower. In the shower, allow soapy water to get on the wound. Avoid scrubbing the wound. When you get out of the shower, dry the wound by patting it with a clean towel. Reapply a dressing over the wound, if needed. Scar care When your wound has completely healed, help decrease the size of your scar by: Wearing sunscreen over the scar or covering it with clothing when you are outside. New scars get sunburned easily, which can make scarring worse. Gently massaging the scarred area. This can decrease scar thickness. General instructions Take over-the-counter and prescription medicines only as told by your health care provider. Keep all follow-up visits. This is important. Contact a health care provider if: You have more redness, swelling, or pain around your wound. You have fluid or blood coming from your wound. You have new warmth, a rash, or hardness at the wound site. You have pus or a bad smell coming from your wound. Your wound opens up. Get help right away if: You have a fever or chills. You have red streaks coming from your wound. Summary After your sutures are removed, it is common to have some discomfort   and swelling in the area. Wash your hands with soap and water before you change your bandage (dressing). Keep the wound area dry and clean. Do not take baths, swim, or use a hot tub until your health care provider approves. This information is not intended to replace advice given to you by your health care provider. Make sure you discuss any questions you have with your health care provider. Document Revised: 07/02/2020 Document Reviewed:  07/02/2020 Elsevier Patient Education  2023 Elsevier Inc.  

## 2021-10-27 NOTE — Progress Notes (Signed)
   Acute Office Visit  Subjective:     Patient ID: Hunter Lynch, adult    DOB: 06/26/1980, 41 y.o.   MRN: [id number]  No chief complaint on file.   HPI Patient is in today for suture removal from hair transplant 12 days ago. Pt denies any worsening pain or drainage. She had procedure done in [place].   ROS See HPI.      Objective:    There were no vitals taken for this visit. BP Readings from Last 3 Encounters:  10/02/21 (!) 159/94  08/22/21 (!) 142/89  04/23/21 114/78      Physical Exam  Well healing incision from ear to ear along occipital scalp. Some areas of irritation and easy bleeding and scab formation.   Trichophytic continuous closure  Sutures were cut and pulled out in strands from one closure to the next. A little over half way through removal and patient wanted to stop and wait for the next day due to soreness and discomfort.       Assessment & Plan:  Marland KitchenMarland KitchenLisle was seen today for suture / staple removal.  Diagnoses and all orders for this visit:  Encounter for removal of sutures   Will return tomorrow for completion of suture removal. No signs of infection. Wound is healing well but very tender along incision.   Tandy Gaw, PA-C

## 2021-10-28 ENCOUNTER — Ambulatory Visit: Payer: 59 | Admitting: Physician Assistant

## 2021-10-28 DIAGNOSIS — Z4802 Encounter for removal of sutures: Secondary | ICD-10-CM

## 2021-10-28 NOTE — Progress Notes (Signed)
   Established Patient Office Visit  Subjective   Patient ID: Hunter Lynch, adult    DOB: 1980/08/23  Age: 41 y.o. MRN: [id number]  Chief Complaint  Patient presents with   Suture / Staple Removal    HPI Pt returns to clinic for completion of suture removal from hair transplant.   ROS See HPI.    Objective:     Physical Exam  Remaining sutures removed without difficulty   Assessment & Plan:  Marland KitchenMarland KitchenRadley was seen today for suture / staple removal.  Diagnoses and all orders for this visit:  Visit for suture removal   Pt tolerated well. No problems or concerns.    Tandy Gaw, PA-C

## 2021-11-18 ENCOUNTER — Ambulatory Visit: Payer: 59 | Admitting: Family Medicine

## 2021-11-18 ENCOUNTER — Encounter: Payer: Self-pay | Admitting: Family Medicine

## 2021-11-18 VITALS — BP 141/97 | HR 76 | Resp 20 | Ht 72.0 in | Wt 191.0 lb

## 2021-11-18 DIAGNOSIS — Z789 Other specified health status: Secondary | ICD-10-CM

## 2021-11-18 DIAGNOSIS — R5383 Other fatigue: Secondary | ICD-10-CM

## 2021-11-18 DIAGNOSIS — Z01818 Encounter for other preprocedural examination: Secondary | ICD-10-CM

## 2021-11-18 DIAGNOSIS — M7918 Myalgia, other site: Secondary | ICD-10-CM | POA: Diagnosis not present

## 2021-11-18 NOTE — Progress Notes (Signed)
Acute Office Visit  Subjective:     Patient ID: Hunter Lynch, adult    DOB: Oct 28, 1980, 41 y.o.   MRN: [id number]  Chief Complaint  Patient presents with   Follow-up    HPI Patient is in today for feeling extreme fatigue and exhaustion and discomfort.  Bee had a hair transplant surgery performed about 3 weeks ago.  Surgery was 10 hours, though no known complications.  No fevers or chills but this week has been a real struggle to sometimes even get out of bed and function.  Feeling completely achy all over and exhausted.  She also reports a buzzing sensation in the top of her head.  She is never really had that happen before.  She has a history of myofascial pain syndrome and is worried that she may be experiencing a flare that she has not had in years.  She does have some chronic ongoing lumbar back pain with degenerative disc disease and has seen Dr. Herma Mering in the past.  Would also like to have an EKG done today as has upcoming surgery in December for liposuction.  Has tried Cymbalta in the past with side effects.  Felt like had a panic attack the first day took the medication.  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  ROS      Objective:    BP (!) 141/97 (BP Location: Left Arm, Cuff Size: Normal)   Pulse 76   Resp 20   Ht 6' (1.829 m)   Wt 191 lb 0.6 oz (86.7 kg)   SpO2 99%   BMI 25.91 kg/m    Physical Exam Constitutional:      Appearance: She is well-developed.  HENT:     Head: Normocephalic and atraumatic.  Eyes:     Conjunctiva/sclera: Conjunctivae normal.  Cardiovascular:     Rate and Rhythm: Normal rate and regular rhythm.     Heart sounds: Normal heart sounds.  Pulmonary:     Effort: Pulmonary effort is normal.     Breath sounds: Normal breath sounds.  Musculoskeletal:     Cervical back: No tenderness.     Comments: Nontender directly over the  cervical or thoracic or lumbar spine.  Paraspinous tenderness to the right upper back between the shoulder blade and the spine.  Some tenderness to the mid thoracic paraspinous muscles.  And some tenderness just above the right hip posteriorly.  Nontender SI joints.  Skin:    General: Skin is warm and dry.     Comments: Incision on posterior scalp is healing really well.  Neurological:     General: No focal deficit present.     Mental Status: She is alert and oriented to person, place, and time.  Psychiatric:        Mood and Affect: Mood normal.        Behavior: Behavior normal.     Results for orders placed or performed in visit on 11/18/21  CBC with Differential/Platelet  Result Value Ref Range   WBC 5.8 3.8 - 10.8 Thousand/uL   RBC 5.54 4.20 - 5.80 Million/uL   Hemoglobin 17.1 13.2 - 17.1 g/dL   HCT 51.1 (H) 38.5 - 50.0 %   MCV 92.2 80.0 - 100.0 fL   MCH 30.9 27.0 - 33.0 pg   MCHC 33.5 32.0 - 36.0 g/dL   RDW 12.2 11.0 - 15.0 %  Platelets 287 140 - 400 Thousand/uL   MPV 9.8 7.5 - 12.5 fL   Neutro Abs 3,480 1,500 - 7,800 cells/uL   Lymphs Abs 1,786 850 - 3,900 cells/uL   Absolute Monocytes 412 200 - 950 cells/uL   Eosinophils Absolute 70 15 - 500 cells/uL   Basophils Absolute 52 0 - 200 cells/uL   Neutrophils Relative % 60 %   Total Lymphocyte 30.8 %   Monocytes Relative 7.1 %   Eosinophils Relative 1.2 %   Basophils Relative 0.9 %  COMPLETE METABOLIC PANEL WITH GFR  Result Value Ref Range   Glucose, Bld 79 65 - 99 mg/dL   BUN 10 7 - 25 mg/dL   Creat 0.86 0.60 - 1.29 mg/dL   eGFR 112 > OR = 60 mL/min/1.19m   BUN/Creatinine Ratio SEE NOTE: 6 - 22 (calc)   Sodium 140 135 - 146 mmol/L   Potassium 4.9 3.5 - 5.3 mmol/L   Chloride 104 98 - 110 mmol/L   CO2 27 20 - 32 mmol/L   Calcium 9.4 8.6 - 10.3 mg/dL   Total Protein 7.0 6.1 - 8.1 g/dL   Albumin 4.7 3.6 - 5.1 g/dL   Globulin 2.3 1.9 - 3.7 g/dL (calc)   AG Ratio 2.0 1.0 - 2.5 (calc)   Total Bilirubin 0.6 0.2 - 1.2  mg/dL   Alkaline phosphatase (APISO) 51 36 - 130 U/L   AST 17 10 - 40 U/L   ALT 15 9 - 46 U/L  TSH  Result Value Ref Range   TSH 1.54 0.40 - 4.50 mIU/L  Sedimentation rate  Result Value Ref Range   Sed Rate 2 0 - 15 mm/h  C-reactive protein  Result Value Ref Range   CRP 0.5 <8.0 mg/L  CK (Creatine Kinase)  Result Value Ref Range   Total CK 37 (L) 44 - 196 U/L  Estradiol  Result Value Ref Range   Estradiol 35 < OR = 39 pg/mL  Testosterone  Result Value Ref Range   Testosterone 582 250 - 827 ng/dL        Assessment & Plan:   Problem List Items Addressed This Visit       Other   Myofascial pain   Relevant Orders   CBC with Differential/Platelet (Completed)   COMPLETE METABOLIC PANEL WITH GFR (Completed)   TSH (Completed)   Sedimentation rate (Completed)   C-reactive protein (Completed)   CK (Creatine Kinase) (Completed)   Estradiol (Completed)   Testosterone (Completed)   Male-to-male transgender person   Relevant Orders   CBC with Differential/Platelet (Completed)   COMPLETE METABOLIC PANEL WITH GFR (Completed)   TSH (Completed)   Sedimentation rate (Completed)   C-reactive protein (Completed)   CK (Creatine Kinase) (Completed)   Estradiol (Completed)   Testosterone (Completed)   Fatigue - Primary   Relevant Orders   CBC with Differential/Platelet (Completed)   COMPLETE METABOLIC PANEL WITH GFR (Completed)   TSH (Completed)   Sedimentation rate (Completed)   C-reactive protein (Completed)   CK (Creatine Kinase) (Completed)   Estradiol (Completed)   Testosterone (Completed)   Other Visit Diagnoses     Preoperative clearance       Relevant Orders   EKG 12-Lead       Fatigue with increased generalized pain-strongly suspect a myofascial syndrome flare.  We will do some additional labs though since did have recent surgery just to rule out any other complications such as infection, shift and thyroid function, significant inflammation to suggest other  etiologies such as autoimmune disorder etc.  She is also due for hormone testing so I did go ahead and place that on the order as well.  Did have recent TSH and iron studies performed earlier this year which were normal and reassuring.  If labs come back normal then consider treatment for myofascial flare.  She does not tolerate Cymbalta.  Consider alternative treatment.  Work note provided.  EKG shows rate of 3 bpm, normal sinus rhythm with no acute ST-T wave changes.  Everything looks reassuring.  We provided a copy to the patient for the surgeon.  No orders of the defined types were placed in this encounter.  I spent 40 minutes on the day of the encounter to include pre-visit record review, face-to-face time with the patient and post visit ordering of test.   Return in about 2 weeks (around 12/02/2021) for [place].  Beatrice Lecher, MD

## 2021-11-20 ENCOUNTER — Encounter: Payer: Self-pay | Admitting: Family Medicine

## 2021-11-20 ENCOUNTER — Telehealth: Payer: Self-pay | Admitting: Neurology

## 2021-11-20 LAB — COMPLETE METABOLIC PANEL WITH GFR
AG Ratio: 2 (calc) (ref 1.0–2.5)
ALT: 15 U/L (ref 9–46)
AST: 17 U/L (ref 10–40)
Albumin: 4.7 g/dL (ref 3.6–5.1)
Alkaline phosphatase (APISO): 51 U/L (ref 36–130)
BUN: 10 mg/dL (ref 7–25)
CO2: 27 mmol/L (ref 20–32)
Calcium: 9.4 mg/dL (ref 8.6–10.3)
Chloride: 104 mmol/L (ref 98–110)
Creat: 0.86 mg/dL (ref 0.60–1.29)
Globulin: 2.3 g/dL (calc) (ref 1.9–3.7)
Glucose, Bld: 79 mg/dL (ref 65–99)
Potassium: 4.9 mmol/L (ref 3.5–5.3)
Sodium: 140 mmol/L (ref 135–146)
Total Bilirubin: 0.6 mg/dL (ref 0.2–1.2)
Total Protein: 7 g/dL (ref 6.1–8.1)
eGFR: 112 mL/min/{1.73_m2} (ref >=60)

## 2021-11-20 LAB — TESTOSTERONE: Testosterone: 582 ng/dL (ref 250–827)

## 2021-11-20 LAB — CBC WITH DIFFERENTIAL/PLATELET
Absolute Monocytes: 412 cells/uL (ref 200–950)
Basophils Absolute: 52 cells/uL (ref 0–200)
Basophils Relative: 0.9 %
Eosinophils Absolute: 70 cells/uL (ref 15–500)
Eosinophils Relative: 1.2 %
HCT: 51.1 % — ABNORMAL HIGH (ref 38.5–50.0)
Hemoglobin: 17.1 g/dL (ref 13.2–17.1)
Lymphs Abs: 1786 cells/uL (ref 850–3900)
MCH: 30.9 pg (ref 27.0–33.0)
MCHC: 33.5 g/dL (ref 32.0–36.0)
MCV: 92.2 fL (ref 80.0–100.0)
MPV: 9.8 fL (ref 7.5–12.5)
Monocytes Relative: 7.1 %
Neutro Abs: 3480 cells/uL (ref 1500–7800)
Neutrophils Relative %: 60 %
Platelets: 287 10*3/uL (ref 140–400)
RBC: 5.54 10*6/uL (ref 4.20–5.80)
RDW: 12.2 % (ref 11.0–15.0)
Total Lymphocyte: 30.8 %
WBC: 5.8 10*3/uL (ref 3.8–10.8)

## 2021-11-20 LAB — SEDIMENTATION RATE: Sed Rate: 2 mm/h (ref 0–15)

## 2021-11-20 LAB — CK: Total CK: 37 U/L — ABNORMAL LOW (ref 44–196)

## 2021-11-20 LAB — ESTRADIOL: Estradiol: 35 pg/mL (ref <=39)

## 2021-11-20 LAB — TSH: TSH: 1.54 mIU/L (ref 0.40–4.50)

## 2021-11-20 LAB — C-REACTIVE PROTEIN: CRP: 0.5 mg/L (ref <8.0)

## 2021-11-20 NOTE — Progress Notes (Signed)
HI Bee,  Blood Count is normal.  Hemoglobin borderline elevated so we will need to just continue to monitor but right now is acceptable.  No excess muscle breakdown.  Kidney and liver function and blood salts look great!  Thyroid looks absolutely perfect.  No excess inflammation which is fantastic.  Your estrogen levels are low.  This could be somewhat affecting how you are feeling.  Did any of your medications changed recently or have you been unable to get your medication?

## 2021-11-20 NOTE — Telephone Encounter (Signed)
Forms completed, copy sent to scan, copy to hold. Given to front desk to collect payment.   

## 2021-12-01 ENCOUNTER — Other Ambulatory Visit: Payer: Self-pay | Admitting: Medical-Surgical

## 2022-01-02 ENCOUNTER — Encounter: Payer: Self-pay | Admitting: Medical-Surgical

## 2022-01-02 ENCOUNTER — Ambulatory Visit: Payer: 59 | Admitting: Medical-Surgical

## 2022-01-02 VITALS — BP 121/82 | HR 74 | Resp 20 | Ht 72.0 in | Wt 190.0 lb

## 2022-01-02 DIAGNOSIS — Z789 Other specified health status: Secondary | ICD-10-CM | POA: Diagnosis not present

## 2022-01-02 DIAGNOSIS — Z0289 Encounter for other administrative examinations: Secondary | ICD-10-CM | POA: Diagnosis not present

## 2022-01-02 NOTE — Progress Notes (Signed)
Established Patient Office Visit  Subjective   Patient ID: Hunter Lynch, transgender male   DOB: 1981-02-20 Age: 41 y.o. MRN: [id number]   Chief Complaint  Patient presents with   Referral   HPI Pleasant 41 year old transgender male presenting today for the following:  Requesting a referral to [facility]. She is looking to have some facial feminizing procedures completed using fillers and has found that [facility] has a clinic that provides specialized care for transgender patients.   Has been working for years to become comfortable in her body and the journey has been quite frustrating. Her insurance covers gender affirming surgeries as part of medically necessary care for transgender patients. She has been trying hard to find a surgeon that can help with her lower body aesthetics. She has found a Psychologist, sport and exercise in [place] that will be doing bilateral hip implants on December 12th followed by further surgery in April of next year. As the hip implants are considered cosmetic, her surgeon is unable to complete FMLA paperwork since it will not be considered medically necessary. She is asking to have FMLA paperwork completed to allow for time out of work for the extensive recovery involved.   Notes that she recently had labs done at a visit with Dr. Madilyn Fireman. She has been making some changes in her hormone regimen and is now microdosing with estradiol and progesterone. Her testosterone levels are very high and her estrogen is low. She reports feeling better now that her testosterone is up and is no longer having issues with sexual function. Is currently happy with things as they are and plans to continue microdosing.    Objective:    Vitals:   01/02/22 1512  BP: 121/82  Pulse: 74  Resp: 20  Height: 6' (1.829 m)  Weight: 190 lb (86.2 kg)  SpO2: 100%  BMI (Calculated): 25.76    Physical Exam Vitals and nursing note reviewed.  Constitutional:      General: She is not in acute  distress.    Appearance: Normal appearance. She is not ill-appearing.  HENT:     Head: Normocephalic and atraumatic.  Cardiovascular:     Rate and Rhythm: Normal rate and regular rhythm.  Pulmonary:     Effort: Pulmonary effort is normal. No respiratory distress.  Skin:    General: Skin is warm and dry.  Neurological:     Mental Status: She is alert and oriented to person, place, and time.  Psychiatric:        Mood and Affect: Mood normal.        Behavior: Behavior normal.        Thought Content: Thought content normal.        Judgment: Judgment normal.   No results found for this or any previous visit (from the past 24 hour(s)).     The 10-year ASCVD risk score (Arnett DK, et al., 2019) is: 1.7%   Values used to calculate the score:     Age: 87 years     Sex: Male     Is Non-Hispanic African American: No     Diabetic: No     Tobacco smoker: Yes     Systolic Blood Pressure: 341 mmHg     Is BP treated: No     HDL Cholesterol: 64 mg/dL     Total Cholesterol: 169 mg/dL   Assessment & Plan:   1. Male-to-male transgender person Referring to [facility] per patient request. Ok to continue current doses of hormones if  feeling okay.  - Ambulatory referral to Dermatology  2. Encounter for completion of form with patient Advised patient to have FMLA paperwork sent to our office for review and completion. Recommend she contact her HR department to determine if the FMLA allotment is on a rolling calendar or not as this may affect her plans for the second surgery in April.   Return in about 3 months (around 04/04/2022) for transgender care/post-op follow up.  ___________________________________________ Thayer Ohm, DNP, APRN, FNP-BC Primary Care and Sports Medicine [facility]

## 2022-01-23 ENCOUNTER — Ambulatory Visit: Payer: 59 | Admitting: Medical-Surgical

## 2022-01-26 ENCOUNTER — Other Ambulatory Visit: Payer: Self-pay | Admitting: Medical-Surgical

## 2022-02-16 ENCOUNTER — Encounter: Payer: Self-pay | Admitting: Medical-Surgical

## 2022-02-24 ENCOUNTER — Ambulatory Visit: Payer: 59 | Admitting: Medical-Surgical

## 2022-02-24 ENCOUNTER — Encounter: Payer: Self-pay | Admitting: Medical-Surgical

## 2022-02-24 VITALS — BP 135/76 | HR 73 | Resp 20 | Ht 72.0 in | Wt 196.1 lb

## 2022-02-24 DIAGNOSIS — Z01818 Encounter for other preprocedural examination: Secondary | ICD-10-CM

## 2022-02-24 DIAGNOSIS — F419 Anxiety disorder, unspecified: Secondary | ICD-10-CM

## 2022-02-24 MED ORDER — METOPROLOL SUCCINATE ER 25 MG PO TB24: 25.0000 mg | ORAL_TABLET | Freq: Every day | ORAL | 1 refills | Status: DC

## 2022-02-24 NOTE — Progress Notes (Signed)
Established Patient Office Visit  Subjective   Patient ID: Hunter Lynch, transgender male   DOB: 13-Aug-1980 Age: 40 y.o. MRN: [id number]   Chief Complaint  Patient presents with   SURGICAL CLEARANCE    HPI Pleasant 41 year old transgender male presenting today for the following:   Surgical clearance: She will be going to [place] next week to have a BBL with fat transfer completed with Dr. Jarvis Newcomer. Has accommodations in a recovery suite that is designed for plastic surgery recovery and is very excited to finally have this done. She reports needing a letter from her PCP clearing her for the surgery and directing if she should continue Toprol XL for her blood pressure during her procedure and the recovery period.   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 Has also tried multiple medications in the past that were not helpful for mood management. She has been using several different essential oils lately and finds it helpful to manage her anxiety, especially while at work dealing with the public. 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 She is requesting a letter for her employer to allow her to use the essential oils for her medical reasons.    Objective:    Vitals:   02/24/22 1614  BP: 135/76  Pulse: 73  Resp: 20  Height: 6' (1.829 m)  Weight: 196 lb 1.6 oz (89 kg)  SpO2: 98%  BMI (Calculated): 26.59    Physical Exam Vitals and nursing note reviewed.  Constitutional:      General: She is not in acute distress.    Appearance: Normal appearance. She is not ill-appearing.  HENT:     Head: Normocephalic and atraumatic.  Cardiovascular:     Rate and Rhythm: Normal rate and regular rhythm.     Pulses: Normal pulses.     Heart sounds: Normal heart sounds.  Pulmonary:     Effort: Pulmonary  effort is normal. No respiratory distress.     Breath sounds: Normal breath sounds. No wheezing, rhonchi or rales.  Skin:    General: Skin is warm and dry.  Neurological:     Mental Status: She is alert and oriented to person, place, and time.  Psychiatric:        Mood and Affect: Mood normal.        Behavior: Behavior normal.        Thought Content: Thought content normal.        Judgment: Judgment normal.   No results found for this or any previous visit (from the past 24 hour(s)).     The 10-year ASCVD risk score (Arnett DK, et al., 2019) is: 2%   Values used to calculate the score:     Age: 48 years     Sex: Male     Is Non-Hispanic African American: No     Diabetic: No     Tobacco smoker: Yes     Systolic Blood Pressure: 135 mmHg     Is BP treated: No     HDL Cholesterol: 64 mg/dL     Total Cholesterol: 169 mg/dL   Assessment & Plan:   1. Preoperative clearance She is a low risk for a low to moderate risk procedure. Recommend she continue Toprol-XL for her blood pressure and anxiety management. Letter provided in written copy and  via MyChart.   2. Anxiety Some benefit from Toprol-XL. With her history, avoiding adding other medications. Letter provided in written copy and via MyChart requesting medical accommodation to allow the use of essential oils while at work.   Return if symptoms worsen or fail to improve.  ___________________________________________ Thayer Ohm, DNP, APRN, FNP-BC Primary Care and Sports Medicine [facility]

## 2022-03-24 ENCOUNTER — Other Ambulatory Visit: Payer: Self-pay | Admitting: Medical-Surgical

## 2022-03-24 DIAGNOSIS — Z789 Other specified health status: Secondary | ICD-10-CM

## 2022-04-09 ENCOUNTER — Ambulatory Visit: Payer: 59 | Admitting: Medical-Surgical

## 2022-04-09 ENCOUNTER — Encounter: Payer: Self-pay | Admitting: Medical-Surgical

## 2022-04-09 VITALS — BP 126/78 | HR 75 | Resp 20 | Ht 72.0 in | Wt 196.0 lb

## 2022-04-09 DIAGNOSIS — L7682 Other postprocedural complications of skin and subcutaneous tissue: Secondary | ICD-10-CM

## 2022-04-09 NOTE — Progress Notes (Signed)
Established Patient Office Visit  Subjective   Patient ID: Hunter Lynch, transgender male   DOB: 1980-12-16 Age: 42 y.o. MRN: [id number]   Chief Complaint  Patient presents with   Follow-up   Post-op Problem   HPI Pleasant 42 year old transgender male presenting today for evaluation of post-surgical complications.  Plastic surgery for [place] butt lift as well asApproximately 1 month ago, had the implants.  This has gone fairly well although the surgery took longer than originally planned.  She stayed in a recovery house for approximately 8 days and then returned home.  Was able to get on and off the pain medications without difficulty.  There was some concern about addiction in the past but is happy to report that she is doing well without the pain meds.  She is currently taking gabapentin for pain which is working very well for her.  She is on a very low-dose of 100 mg up to 3 times daily but reports not taking this as often as it is prescribed since she is very cautious.  Not having any fevers, chills, or other concerning symptoms.  Reports that there are 2 holes that did not close up with the incisions and she has been prescribed antibiotics to help treat any potential infection.  Has 2 JP drains in place that she has been getting approximately 30 cc out of each day.  Notes that she is supposed to go back to plastic surgery for further evaluation in a couple of weeks and they will look at discontinuing the drains if they are putting out less than 20 cc/day.  Has been doing dressings to the wounds per plastic surgery recommendations but is worried that there may be something else going on.  Would like in person evaluation today for recommendations and clarification of expectations.   Objective:    Vitals:   04/09/22 1011  BP: 126/78  Pulse: 75  Resp: 20  Height: 6' (1.829 m)  Weight: 196 lb (88.9 kg)  SpO2: 99%  BMI (Calculated): 26.58    Physical Exam Vitals and nursing  note reviewed.  Constitutional:      General: She is not in acute distress.    Appearance: Normal appearance. She is not ill-appearing.  HENT:     Head: Normocephalic and atraumatic.  Cardiovascular:     Rate and Rhythm: Normal rate and regular rhythm.     Pulses: Normal pulses.  Pulmonary:     Effort: Pulmonary effort is normal. No respiratory distress.  Skin:    General: Skin is warm and dry.     Findings: Wound (bilateral open wounds approximately 4cmx2cmx1cm on each side of the gluteal crease at the site of recent surgery. Sutures and JP drains in place. Wound beds pink with minimal thick cream drainage present. No foul odor, purulent drainage, erythema.) present.  Neurological:     Mental Status: She is alert and oriented to person, place, and time.  Psychiatric:        Mood and Affect: Mood normal.        Behavior: Behavior normal.        Thought Content: Thought content normal.        Judgment: Judgment normal.   No results found for this or any previous visit (from the past 24 hour(s)).     The 10-year ASCVD risk score (Arnett DK, et al., 2019) is: 1.8%   Values used to calculate the score:     Age: 42 years  Sex: Male     Is Non-Hispanic African American: No     Diabetic: No     Tobacco smoker: Yes     Systolic Blood Pressure: 025 mmHg     Is BP treated: No     HDL Cholesterol: 64 mg/dL     Total Cholesterol: 169 mg/dL   Assessment & Plan:   1. Postoperative surgical complication involving skin associated with non-dermatologic procedure, unspecified complication On evaluation, surgical wounds appear to be healing well. JP drains with serosanguinous, clear fluid in the tubing and bulbs. No unusual heat, swelling, erythema, or drainage noted. Wound cultures done to both wounds for further evaluation. Expect these wounds will take several weeks to heal by secondary closure. Recommend wet to dry normal saline dressing changes twice daily. Reviewed appropriate  technique with patient's husband who is helping with site care.  - Anaerobic and Aerobic Culture  Return if symptoms worsen or fail to improve.  ___________________________________________ Clearnce Sorrel, DNP, APRN, FNP-BC Primary Care and [place]

## 2022-04-09 NOTE — Addendum Note (Signed)
Addended by: Beverlee Nims on: 04/09/2022 01:13 PM   Modules accepted: Orders

## 2022-04-15 ENCOUNTER — Encounter: Payer: Self-pay | Admitting: Medical-Surgical

## 2022-04-15 LAB — ANAEROBIC AND AEROBIC CULTURE
MICRO NUMBER:: 14447736
MICRO NUMBER:: 14447737
MICRO NUMBER:: 14447744
MICRO NUMBER:: 14447745
SPECIMEN QUALITY:: ADEQUATE
SPECIMEN QUALITY:: ADEQUATE
SPECIMEN QUALITY:: ADEQUATE
SPECIMEN QUALITY:: ADEQUATE

## 2022-04-16 NOTE — Telephone Encounter (Signed)
Wound culture results  faxed  to [phone]. 04/16/22 @2 :30pm

## 2022-04-24 ENCOUNTER — Ambulatory Visit: Payer: 59 | Admitting: Medical-Surgical

## 2022-05-01 ENCOUNTER — Encounter: Payer: Self-pay | Admitting: Medical-Surgical

## 2022-05-01 ENCOUNTER — Telehealth: Payer: 59 | Admitting: Medical-Surgical

## 2022-05-01 VITALS — BP 138/62 | HR 75 | Resp 20 | Ht 72.0 in | Wt 199.2 lb

## 2022-05-01 DIAGNOSIS — L7682 Other postprocedural complications of skin and subcutaneous tissue: Secondary | ICD-10-CM | POA: Diagnosis not present

## 2022-05-01 DIAGNOSIS — M7918 Myalgia, other site: Secondary | ICD-10-CM

## 2022-05-01 DIAGNOSIS — Z789 Other specified health status: Secondary | ICD-10-CM

## 2022-05-01 MED ORDER — GABAPENTIN 300 MG PO CAPS: 300.0000 mg | ORAL_CAPSULE | Freq: Three times a day (TID) | ORAL | 5 refills | Status: DC | PRN

## 2022-05-01 NOTE — Progress Notes (Signed)
Established Patient Office Visit  Subjective   Patient ID: Hunter Lynch, transgender male   DOB: Nov 03, 1980 Age: 42 y.o. MRN: [id number]   Chief Complaint  Patient presents with   Post-op Follow-up    HPI Pleasant 42 year old transgender male presenting today for the following:  Recently went to see her Psychiatric nurse in [place].  Has been doing well overall with the surgery however has had some postsurgical complications that are concerning.  Is currently waiting on cultures from the drainage to determine if there is any further infection.  Has 2 JP drains in place and they are putting out about 25 mL daily.  Notes that her plastic surgeon has advised that the drains will come out when she has had 3 days consecutively of less than 20 mL daily.  Reports that she would likely come to me for drain removal once she is given the clear for this.  Her husband has been helping her with the site care on her wounds and these appear to be healing well.  Would like me to take a look at these today for reassurance.  Due to the increased discomfort to the surgery, she has limited her activity and been more sedentary around home.  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  Has had gabapentin prescribed by the surgeon but notes that the 100 mg dose does not seem to be helping anymore.  She is very cognizant of her history as an addict and wants to avoid any medications or behaviors that may put her in danger of returning to poor habits.  Has some leftover pain medication from a previous surgery and ended up taking a dose this morning.  The pain medication is kept locked in a safe where she does not have access to it.  Would like to discuss options that do not put her in danger of a relapse for drug addiction.  On last check, her estradiol levels were extremely low and testosterone was elevated.  At this point, we are due for recheck.   Has continued estradiol and medroxyprogesterone as previously prescribed.  Due for recheck of labs but would like to hold off on any medication changes while in the healing process.   Objective:    Vitals:   05/01/22 0932 05/01/22 0935  BP: 138/62 138/62  Pulse: 75 75  Resp: 20   Height: 6' (1.829 m)   Weight: 199 lb 3.2 oz (90.4 kg)   SpO2:  100%  BMI (Calculated): 27.01     Physical Exam Vitals and nursing note reviewed.  Constitutional:      General: She is not in acute distress.    Appearance: Normal appearance. She is not ill-appearing.  HENT:     Head: Normocephalic and atraumatic.  Cardiovascular:     Rate and Rhythm: Normal rate and regular rhythm.     Pulses: Normal pulses.     Heart sounds: Normal heart sounds.  Pulmonary:     Effort: Pulmonary effort is normal. No respiratory distress.     Breath sounds: Normal breath sounds. No wheezing, rhonchi or rales.  Skin:    General: Skin is warm and dry.          Comments: Bilateral JP drains just above the noted wound sites sutured in place with crusted serous drainage at the insertion.  Minimal erythema at insertion sites.  Sutures in place.  Neurological:     Mental Status:  She is alert and oriented to person, place, and time.  Psychiatric:        Mood and Affect: Mood normal.        Behavior: Behavior normal.        Thought Content: Thought content normal.        Judgment: Judgment normal.   No results found for this or any previous visit (from the past 24 hour(s)).     The 10-year ASCVD risk score (Arnett DK, et al., 2019) is: 2.1%   Values used to calculate the score:     Age: 65 years     Sex: Male     Is Non-Hispanic African American: No     Diabetic: No     Tobacco smoker: Yes     Systolic Blood Pressure: 0000000 mmHg     Is BP treated: No     HDL Cholesterol: 64 mg/dL     Total Cholesterol: 169 mg/dL   Assessment & Plan:   1. Male-to-male transgender person Last estradiol levels subtherapeutic  and testosterone is not well-controlled.  Placing labs to have these rechecked.  Patient plans to come back and do these on a different day wants she feels that she is healing/healed. - Estradiol - Testosterone - CBC with Differential - Lipid panel  2. Postoperative surgical complication involving skin associated with non-dermatologic procedure, unspecified complication On exam today, wounds look much better than they did previously and are on the road to healing completely.  No current concerns for wound infections or infections at the drain sites.  Follow instructions per plastic surgery for continued care and monitoring.  Advised that sedentary behaviors are probably not helping the situation and he.  Would like for her to do simple activities such as walking on a regular basis to help prevent scar tissue, adhesions, and worsening of autoimmune issues.  In the meantime, discussed the use of medications to control pain.  Would like to avoid narcotic pain medications due to the history of drug use/dependence.  Gabapentin taken responsibility is a safe medication to use.  Since the 100 mg dose is not helpful, increasing to 300 mg 3 times daily as needed.  Husband available in the room for all instructions and is aware of the recommendations.  He is agreeable to having monitor the use of this medication. - gabapentin (NEURONTIN) 300 MG capsule; Take 1 capsule (300 mg total) by mouth every 8 (eight) hours as needed.  Dispense: 90 capsule; Refill: 5  3. Myofascial pain Gabapentin as above.   Return if symptoms worsen or fail to improve.  ___________________________________________ Clearnce Sorrel, DNP, APRN, FNP-BC Primary Care and [place]

## 2022-05-05 ENCOUNTER — Encounter: Payer: Self-pay | Admitting: Medical-Surgical

## 2022-05-05 ENCOUNTER — Ambulatory Visit: Payer: 59 | Admitting: Medical-Surgical

## 2022-05-05 VITALS — BP 134/88 | HR 76 | Temp 98.1°F | Resp 20 | Ht 72.0 in | Wt 196.3 lb

## 2022-05-05 DIAGNOSIS — L7682 Other postprocedural complications of skin and subcutaneous tissue: Secondary | ICD-10-CM

## 2022-05-05 LAB — TIQ-NTM

## 2022-05-05 NOTE — Progress Notes (Signed)
Established Patient Office Visit  Subjective   Patient ID: Hunter Lynch, transgender male   DOB: 1981/02/11 Age: 42 y.o. MRN: [id number]   Chief Complaint  Patient presents with   Post-op Problem    HPI Pleasant 42 year old transgender male accompanied by her husband presenting today for further discussion of postop complications.  Since our last visit on 05/01/2022, she has developed a change in the fluids collected in the JP drains.  She also reports that there has been drainage around the JP drain insertion site on the left side.  This all started after she left her appointment last week.  She and her husband went for a power walk for 1 hour.  That evening, she developed nausea with vomiting that occurred 4 times.  She had no fevers but did endorse chills for short period of time.  The next morning, she got up and was very swollen on the left side in the buttock and hip area.  She noted drainage that had seeped through to her undergarments.  At 1 point, they evaluated the drainage and it was green and thicker than usual.  She contacted her plastic surgeon on Saturday and he put her on Bactrim and ciprofloxacin.  He did note that he wanted her to come to her local provider to have wound cultures as well as fluid cultures collected from both drains.  Denies any further nausea, vomiting, fever, or chills.  Has continued to do wet-to-dry dressing with her husband's help.  Notes that they have been instructed to strip the drains as frequently as possible to help keep the lines patent.  Once the results are available for the wound cultures, they have been instructed to send these to her plastic surgeon.   Objective:    Vitals:   05/05/22 1345  BP: 134/88  Pulse: 76  Temp: 98.1 F (36.7 C)  Resp: 20  Height: 6' (1.829 m)  Weight: 196 lb 4.8 oz (89 kg)  SpO2: 98%  BMI (Calculated): 26.62    Physical Exam Vitals reviewed.  Constitutional:      General: She is not in acute  distress.    Appearance: Normal appearance. She is not ill-appearing.  HENT:     Head: Normocephalic and atraumatic.  Cardiovascular:     Rate and Rhythm: Normal rate and regular rhythm.     Pulses: Normal pulses.     Heart sounds: Normal heart sounds.  Pulmonary:     Effort: Pulmonary effort is normal. No respiratory distress.     Breath sounds: Normal breath sounds. No wheezing, rhonchi or rales.  Skin:    General: Skin is warm and dry.  Neurological:     Mental Status: She is alert and oriented to person, place, and time.  Psychiatric:        Mood and Affect: Mood normal.        Behavior: Behavior normal.        Thought Content: Thought content normal.        Judgment: Judgment normal.   No results found for this or any previous visit (from the past 24 hour(s)).     The 10-year ASCVD risk score (Arnett DK, et al., 2019) is: 2.2%   Values used to calculate the score:     Age: 42 years     Sex: Male     Is Non-Hispanic African American: No     Diabetic: No     Tobacco smoker: Yes     Systolic  Blood Pressure: 134 mmHg     Is BP treated: No     HDL Cholesterol: 64 mg/dL     Total Cholesterol: 169 mg/dL   Assessment & Plan:   1. Postoperative surgical complication involving skin associated with non-dermatologic procedure, unspecified complication Bilateral buttock wounds evaluated with beefy red granulation tissue and no active purulent drainage.  Culture swabs collected from each.  Drainage in left JP tube slightly cloudy with fibrous tissue present.  Drainage in right JP drain yellowish-green, clear.  Cultures collected. - Anaerobic and Aerobic Culture - Anaerobic and Aerobic Culture - Anaerobic and Aerobic Culture - Anaerobic and Aerobic Culture  Return if symptoms worsen or fail to improve.  ___________________________________________ Clearnce Sorrel, DNP, APRN, FNP-BC Primary Care and [place]

## 2022-05-06 ENCOUNTER — Encounter: Payer: Self-pay | Admitting: Medical-Surgical

## 2022-05-09 ENCOUNTER — Telehealth: Payer: 59 | Admitting: Urgent Care

## 2022-05-09 DIAGNOSIS — Z22322 Carrier or suspected carrier of Methicillin resistant Staphylococcus aureus: Secondary | ICD-10-CM

## 2022-05-09 DIAGNOSIS — L089 Local infection of the skin and subcutaneous tissue, unspecified: Secondary | ICD-10-CM | POA: Diagnosis not present

## 2022-05-09 DIAGNOSIS — T148XXA Other injury of unspecified body region, initial encounter: Secondary | ICD-10-CM | POA: Diagnosis not present

## 2022-05-09 NOTE — Patient Instructions (Signed)
  Hunter Lynch, thank you for joining Chaney Malling, PA for today's virtual visit.  While this provider is not your primary care provider (PCP), if your PCP is located in our provider database this encounter information will be shared with them immediately following your visit.   [facility] account gives you access to today's visit and all your visits, tests, and labs performed at [facility] " click here if you don't have a [facility] account or go to [email or web address]  Consent: (Patient) Hunter Lynch provided verbal consent for this virtual visit at the beginning of the encounter.  Current Medications:  Current Outpatient Medications:    B-D SYRINGE LUER-LOK 1CC 1 ML MISC, INJECT 1 SYRINGE UNDER THE SKIN ONE DAY A WEEK, Disp: 12 each, Rfl: 0   ciprofloxacin (CIPRO) 500 MG tablet, Take 500 mg by mouth 2 (two) times daily., Disp: , Rfl:    clotrimazole-betamethasone (LOTRISONE) cream, Apply 1 application. topically daily., Disp: 30 g, Rfl: 0   estradiol valerate (DELESTROGEN) 20 MG/ML injection, INJECT 1.5 ML (30 MG TOTAL) INTRAMUSCULARLY  ONCE A WEEK, Disp: 20 mL, Rfl: 0   gabapentin (NEURONTIN) 300 MG capsule, Take 1 capsule (300 mg total) by mouth every 8 (eight) hours as needed., Disp: 90 capsule, Rfl: 5   medroxyPROGESTERone Acetate 150 MG/ML SUSY, Inject 1 mL (150 mg total) into the muscle every 3 (three) months., Disp: 1 mL, Rfl: 5   metoprolol succinate (TOPROL-XL) 25 MG 24 hr tablet, Take 1 tablet (25 mg total) by mouth daily., Disp: 90 tablet, Rfl: 1   NEEDLE, DISP, 18 G 18G X 1" MISC, To draw estradiol valerate, Disp: 12 each, Rfl: 1   NEEDLE, DISP, 25 G (BD DISP NEEDLES) 25G X 5/8" MISC, To inject estradiol valerate weekly, Disp: 12 each, Rfl: 1   sulfamethoxazole-trimethoprim (BACTRIM DS) 800-160 MG tablet, Take 1 tablet by mouth 2 (two) times daily., Disp: , Rfl:    Medications ordered in this encounter:  No orders of the  defined types were placed in this encounter.    *If you need refills on other medications prior to your next appointment, please contact your pharmacy*  Follow-Up: Call back or seek an in-person evaluation if the symptoms worsen or if the condition fails to improve as anticipated.  [place] [phone]  Other Instructions Please continue your antibiotics as prescribed by the surgeon. Your preliminary test results have been sent to you on a communications form. Please follow up with surgeon for any additional complications or recommendations.   If you have been instructed to have an in-person evaluation today at a local Urgent Care facility, please use the link below. It will take you to a list of all of our available [facility], including address, phone number and hours of operation. Please do not delay care.  [facility]  If you or a family member do not have a primary care provider, use the link below to schedule a visit and establish care. When you choose a [facility] primary care physician or advanced practice provider, you gain a long-term partner in health. Find a Primary Care Provider  Learn more about 's in-office and virtual care options: [facility]

## 2022-05-09 NOTE — Progress Notes (Signed)
Virtual Visit Consent   ZBIGNIEW ANDIS, you are scheduled for a virtual visit with a [facility] provider today. Just as with appointments in the office, your consent must be obtained to participate. Your consent will be active for this visit and any virtual visit you may have with one of our providers in the next 365 days. If you have a MyChart account, a copy of this consent can be sent to you electronically.  As this is a virtual visit, video technology does not allow for your provider to perform a traditional examination. This may limit your provider's ability to fully assess your condition. If your provider identifies any concerns that need to be evaluated in person or the need to arrange testing (such as labs, EKG, etc.), we will make arrangements to do so. Although advances in technology are sophisticated, we cannot ensure that it will always work on either your end or our end. If the connection with a video visit is poor, the visit may have to be switched to a telephone visit. With either a video or telephone visit, we are not always able to ensure that we have a secure connection.  By engaging in this virtual visit, you consent to the provision of healthcare and authorize for your insurance to be billed (if applicable) for the services provided during this visit. Depending on your insurance coverage, you may receive a charge related to this service.  I need to obtain your verbal consent now. Are you willing to proceed with your visit today? Hunter Lynch has provided verbal consent on 05/09/2022 for a virtual visit (video or telephone). Chaney Malling, PA  Date: 05/09/2022 3:10 PM  Virtual Visit via Video Note   I, Williams, connected with  Hunter Lynch  ([id number], August 26, 1980) on 05/09/22 at  2:30 PM EST by a video-enabled telemedicine application and verified that I am speaking with the correct person using two identifiers.  Location: Patient: Virtual Visit Location  Patient: Home Provider: Virtual Visit Location Provider: Home Office   I discussed the limitations of evaluation and management by telemedicine and the availability of in person appointments. The patient expressed understanding and agreed to proceed.    History of Present Illness: Hunter Lynch is a 42 y.o. who identifies as a transgender male who was assigned adult at birth, and is being seen today for culture results.  HPI: 42yo male had butt implants placed in Dec. Has had complications since. Had wound cultures performed on 05/05/22 and is in need of the results. She had a R and L wound culture performed. Additionally, she has a drain placed in her buttocks. The results of the drain are still pending. She has been prescribed both bactrim and cipro. She feels they are helping. One drain is still draining yellow fluid, the other has pinkish fluid. She is following closely with her surgeon in [place] and is in need of the results to send to him to determine next steps. Pt denies any systemic symptoms, most notable for the absence of fever.    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 topically daily., Disp: 30 g, Rfl: 0   estradiol valerate (DELESTROGEN) 20 MG/ML injection, INJECT 1.5 ML (30 MG TOTAL) INTRAMUSCULARLY  ONCE A WEEK, Disp: 20 mL, Rfl: 0   gabapentin (NEURONTIN) 300 MG capsule, Take 1 capsule (300 mg total) by mouth every 8 (eight) hours as needed., Disp: 90 capsule, Rfl: 5   medroxyPROGESTERone Acetate 150 MG/ML SUSY, Inject 1 mL (150 mg total) into the muscle every 3 (three) months., Disp: 1 mL, Rfl: 5   metoprolol succinate (TOPROL-XL) 25 MG 24 hr tablet, Take 1 tablet (25 mg total) by mouth daily., Disp: 90 tablet, Rfl: 1   NEEDLE, DISP, 18 G 18G X 1" MISC, To draw estradiol valerate, Disp: 12 each, Rfl: 1   NEEDLE, DISP, 25 G (BD DISP NEEDLES) 25G X 5/8" MISC, To inject estradiol valerate weekly, Disp: 12 each, Rfl: 1   sulfamethoxazole-trimethoprim (BACTRIM DS) 800-160 MG tablet, Take 1 tablet by mouth 2 (two) times daily., Disp: , Rfl:   Observations/Objective: Patient is well-developed, well-nourished in no acute distress.  Resting comfortably at home. Non-toxic in appearance Head is normocephalic, atraumatic. Ambulating at home without complication. No labored breathing.  Speech is clear and coherent with logical content.  Patient is alert and oriented at baseline.    Assessment and Plan: 1. Wound infection  2. MRSA (methicillin resistant staph aureus) culture positive  Preliminary buttock results reviewed with pt. Copied and pasted results onto communications so that pt may share with surgeon. Pt to continue with tx plan as outlined by specialist.   Follow Up Instructions: I discussed the  assessment and treatment plan with the patient. The patient was provided an opportunity to ask questions and all were answered. The patient agreed with the plan and demonstrated an understanding of the instructions.  A copy of instructions were sent to the patient via MyChart unless otherwise noted below.    The patient was advised to call back or seek an in-person evaluation if the symptoms worsen or if the condition fails to improve as anticipated.  Time:  I spent 20 minutes with the patient via telehealth technology discussing the above problems/concerns.    [place], PA

## 2022-05-10 LAB — ANAEROBIC AND AEROBIC CULTURE
ANA ISOLATE 1:: NEGATIVE
ANA ISOLATE 1:: NEGATIVE
MICRO NUMBER:: 14558481
MICRO NUMBER:: 14558482
MICRO NUMBER:: 14558486
MICRO NUMBER:: 14558487
SPECIMEN QUALITY:: ADEQUATE
SPECIMEN QUALITY:: ADEQUATE
SPECIMEN QUALITY:: ADEQUATE
SPECIMEN QUALITY:: ADEQUATE

## 2022-05-11 ENCOUNTER — Encounter: Payer: Self-pay | Admitting: Medical-Surgical

## 2022-05-11 DIAGNOSIS — L089 Local infection of the skin and subcutaneous tissue, unspecified: Secondary | ICD-10-CM

## 2022-05-11 NOTE — Telephone Encounter (Signed)
Please fax the wound culture results to Dr. Ethel Rana. Once the drain cultures are back, we can send them separately.  ___________________________________________ Clearnce Sorrel, DNP, APRN, FNP-BC Primary Care and [facility] Medicine [place]

## 2022-05-11 NOTE — Telephone Encounter (Signed)
Results faxed to DR. Ethel Rana @ fax # [phone].

## 2022-05-13 LAB — ANAEROBIC AND AEROBIC CULTURE

## 2022-05-13 LAB — TIQ-NTM

## 2022-05-14 ENCOUNTER — Telehealth: Payer: Self-pay

## 2022-05-15 NOTE — Telephone Encounter (Signed)
none

## 2022-05-18 ENCOUNTER — Ambulatory Visit: Payer: 59 | Admitting: Medical-Surgical

## 2022-05-18 ENCOUNTER — Encounter: Payer: Self-pay | Admitting: Medical-Surgical

## 2022-05-18 VITALS — BP 131/77 | HR 74 | Resp 20 | Ht 72.0 in | Wt 194.8 lb

## 2022-05-18 DIAGNOSIS — L089 Local infection of the skin and subcutaneous tissue, unspecified: Secondary | ICD-10-CM | POA: Diagnosis not present

## 2022-05-18 DIAGNOSIS — T148XXA Other injury of unspecified body region, initial encounter: Secondary | ICD-10-CM | POA: Diagnosis not present

## 2022-05-18 NOTE — Addendum Note (Signed)
Addended by: Beverlee Nims on: 05/18/2022 11:48 AM   Modules accepted: Orders

## 2022-05-18 NOTE — Progress Notes (Signed)
Established Patient Office Visit  Subjective   Patient ID: Hunter Lynch, transgender male   DOB: [birth year] Age: 42 y.o. MRN: [id number]   Chief Complaint  Patient presents with   Follow-up   Post-op Problem    HPI Pleasant 42 year old transgender male accompanied by her husband presenting today for recollection of drain cultures.  These were completed as recommended by her plastic surgeon however the lab was unable to run the specimens.  Today, reports that her antibiotic regimen has been changed to Bactrim and Flagyl to treat the MRSA infection that was identified from wound cultures.  Continues to have drainage from both JP drains but notes that the left one is now more pink than red.  Has an upcoming infectious disease appointment on Wednesday.  Has a lot of questions regarding the current infection and expectations for the future.   Objective:    Vitals:   05/18/22 0946  BP: 131/77  Pulse: 74  Resp: 20  Height: 6' (1.829 m)  Weight: 194 lb 12.8 oz (88.4 kg)  SpO2: 100%  BMI (Calculated): 26.41    Physical Exam Vitals reviewed.  Constitutional:      General: She is not in acute distress.    Appearance: Normal appearance. She is not ill-appearing.  HENT:     Head: Normocephalic and atraumatic.  Cardiovascular:     Rate and Rhythm: Normal rate and regular rhythm.     Pulses: Normal pulses.     Heart sounds: Normal heart sounds.  Pulmonary:     Effort: Pulmonary effort is normal. No respiratory distress.     Breath sounds: Normal breath sounds. No wheezing, rhonchi or rales.  Skin:    General: Skin is warm and dry.     Comments: Bilateral upper buttock JP drains in place, intact.  Left JP drain with serosanguineous fluid in the tubing as well as the bulb.  Right JP drain clear yellow serous drainage.  Neurological:     Mental Status: She is alert and oriented to person, place, and time.  Psychiatric:        Mood and Affect: Mood normal.        Behavior:  Behavior normal.        Thought Content: Thought content normal.        Judgment: Judgment normal.   No results found for this or any previous visit (from the past 24 hour(s)).     The 10-year ASCVD risk score (Arnett DK, et al., 2019) is: 2.1%   Values used to calculate the score:     Age: 38 years     Sex: Male     Is Non-Hispanic African American: No     Diabetic: No     Tobacco smoker: Yes     Systolic Blood Pressure: A999333 mmHg     Is BP treated: No     HDL Cholesterol: 64 mg/dL     Total Cholesterol: 169 mg/dL   Assessment & Plan:   1. Wound infection Checking CBC with differential and CMP today.  JP drainage collected in a sterile specimen container then transferred to swabs for aerobic and anaerobic culture.  This was completed per [facility] instruction.  Specimens will be sent and if any issues arise, we will be back in contact with the lab.  Recommend continuing with the plan to connect with infectious disease.  Questions answered to the best of my ability. - CBC with Differential/Platelet - COMPLETE METABOLIC PANEL WITH GFR  Return if symptoms worsen or fail to improve.  ___________________________________________ Clearnce Sorrel, DNP, APRN, FNP-BC Primary Care and [facility]

## 2022-05-20 ENCOUNTER — Ambulatory Visit: Payer: 59 | Admitting: Internal Medicine

## 2022-05-20 ENCOUNTER — Encounter: Payer: Self-pay | Admitting: Internal Medicine

## 2022-05-20 ENCOUNTER — Other Ambulatory Visit: Payer: Self-pay

## 2022-05-20 VITALS — BP 145/85 | HR 81 | Temp 97.5°F | Ht 72.0 in | Wt 196.0 lb

## 2022-05-20 DIAGNOSIS — T8149XA Infection following a procedure, other surgical site, initial encounter: Secondary | ICD-10-CM

## 2022-05-20 NOTE — Patient Instructions (Signed)
Stop antibiotics Call us if you notice pus, increased pain, redness around the drains or other concerns.

## 2022-05-20 NOTE — Progress Notes (Signed)
[facility] for Infectious Disease      Reason for Consult: wound infection    Referring Physician: Betsey Holiday, NP    Patient ID: Hunter Lynch, adult    DOB: October 31, 1980, 42 y.o.   MRN: [id number]  HPI:   Hunter Lynch is here for evaluation of a possible wound infection.  She underwent a bilateral buttock procedure in [place] by a Psychiatric nurse in December with JP drains placed perioperatively.  She developed some nausea and vomiting and swelling at the surgical site and surgeon was concerned with infection.  Cultures sent from the drains and culture with MSSA and MRSA.  No pus.  Noted serosanguinous drainage in one and some clear yellow fluid in the other drain.  No signs of infection reported.    Past Medical History:  Diagnosis Date   Anxiety    Back pain    Depression    DJD (degenerative joint disease)    Gender dysphoria    Kidney stones    Myofascial pain    Nicotine dependence with current use    Sciatica     Prior to Admission medications   Medication Sig Start Date End Date Taking? Authorizing Provider  B-D SYRINGE LUER-LOK 1CC 1 ML MISC INJECT 1 SYRINGE UNDER THE SKIN ONE DAY A WEEK 03/31/21   Caleen Jobs B, NP  ciprofloxacin (CIPRO) 500 MG tablet Take 500 mg by mouth 2 (two) times daily. 05/02/22   [provider]  clotrimazole-betamethasone (LOTRISONE) cream Apply 1 application. topically daily. 08/22/21   Samuel Bouche, NP  estradiol valerate (DELESTROGEN) 20 MG/ML injection INJECT 1.5 ML (30 MG TOTAL) INTRAMUSCULARLY  ONCE A WEEK 03/24/22   Samuel Bouche, NP  gabapentin (NEURONTIN) 300 MG capsule Take 1 capsule (300 mg total) by mouth every 8 (eight) hours as needed. 05/01/22   Samuel Bouche, NP  medroxyPROGESTERone Acetate 150 MG/ML SUSY Inject 1 mL (150 mg total) into the muscle every 3 (three) months. 02/25/21   Samuel Bouche, NP  metoprolol succinate (TOPROL-XL) 25 MG 24 hr tablet Take 1 tablet (25 mg total) by mouth daily. 02/24/22   Samuel Bouche, NP  NEEDLE, DISP,  18 G 18G X 1" MISC To draw estradiol valerate 02/25/21   Samuel Bouche, NP  NEEDLE, DISP, 25 G (BD DISP NEEDLES) 25G X 5/8" MISC To inject estradiol valerate weekly 02/25/21   Samuel Bouche, NP  sulfamethoxazole-trimethoprim (BACTRIM DS) 800-160 MG tablet Take 1 tablet by mouth 2 (two) times daily. 04/07/22   [provider]    Allergies  Allergen Reactions   Duloxetine Other (See Comments)    Sweating, anxiety   Erythromycin Nausea And Vomiting and Other (See Comments)    Social History   Tobacco Use   Smoking status: Former   Smokeless tobacco: Never  Scientific laboratory technician Use: Some days  Substance Use Topics   Alcohol use: No   Drug use: No    Family History  Problem Relation Age of Onset   Hypertension Father    Hypertension Paternal Uncle    Breast cancer Maternal Grandmother     Review of Systems  Constitutional: negative for fevers and chills All other systems reviewed and are negative    Constitutional: in no apparent distress There were no vitals filed for this visit. EYES: anicteric Respiratory: normal respiratory effort Buttock: two drains in place with serosanguinous fluid; no surrounding erythema, no drainage out of the insertion sites.  Lower wound with no erythema, no drainage  Labs: Lab Results  Component Value Date   WBC 5.8 11/19/2021   HGB 17.1 11/19/2021   HCT 51.1 (H) 11/19/2021   MCV 92.2 11/19/2021   PLT 287 11/19/2021    Lab Results  Component Value Date   CREATININE 0.86 11/19/2021   BUN 10 11/19/2021   NA 140 11/19/2021   K 4.9 11/19/2021   CL 104 11/19/2021   CO2 27 11/19/2021    Lab Results  Component Value Date   ALT 15 11/19/2021   AST 17 11/19/2021   ALKPHOS 59 05/11/2017   BILITOT 0.6 11/19/2021     Assessment: post operative wound infection.  Patient examined and CT scan reviewed.  At this time, there is no sign of active infection at the drain sites or otherwise.  CT without fluid collection. Healing well.  No pus in  the JP drains.  No indication for antibiotic treatment at this time.  Having bacterial growth in the drains not unexpected.  Ideally will have JP drains removed soon.   I discussed return precautions with new pus-like drainage, pain, erythema, swelling.    Plan: 1)  stop antibiotics 2) remain drains when possible per surgery 3) return if there are new concerns for infection.

## 2022-05-25 LAB — ANAEROBIC AND AEROBIC CULTURE
AER RESULT:: NO GROWTH
MICRO NUMBER:: 14620347
MICRO NUMBER:: 14620348
MICRO NUMBER:: 14620349
MICRO NUMBER:: 14620350
SPECIMEN QUALITY:: ADEQUATE
SPECIMEN QUALITY:: ADEQUATE
SPECIMEN QUALITY:: ADEQUATE
SPECIMEN QUALITY:: ADEQUATE

## 2022-06-03 ENCOUNTER — Telehealth: Payer: Self-pay | Admitting: Medical-Surgical

## 2022-06-03 NOTE — Telephone Encounter (Signed)
Scheduled for 4 PM 06/04/2022

## 2022-06-03 NOTE — Telephone Encounter (Signed)
Patient called would like to have surgical drain removed asap your schedule is full please advise

## 2022-06-04 ENCOUNTER — Encounter: Payer: Self-pay | Admitting: Medical-Surgical

## 2022-06-04 ENCOUNTER — Ambulatory Visit: Payer: 59 | Admitting: Medical-Surgical

## 2022-06-04 VITALS — BP 129/78 | HR 76 | Resp 20 | Ht 72.0 in | Wt 199.2 lb

## 2022-06-04 DIAGNOSIS — L24A9 Irritant contact dermatitis due friction or contact with other specified body fluids: Secondary | ICD-10-CM

## 2022-06-04 NOTE — Progress Notes (Signed)
Established Patient Office Visit  Subjective   Patient ID: Hunter Lynch, transgender male   DOB: 1980/06/03 Age: 42 y.o. MRN: [id number]   Chief Complaint  Patient presents with   PCR DRAINS REMOVAL    HPI Pleasant 42 year old transgender male presenting today for removal of her JP drains.  She is a little over 2 months postop from bilateral hip implants.  She has had complications that started shortly after her surgery.  Her 2 JP drains have been present over the last 2 months and she was told by her plastic surgeon that it would be removed when the drainage was down to less than 20 cc/day.  Today, she reports that she has spoken to her plastic surgeon and he has recommended that they go ahead and remove the drains as they could be causing irritation that is causing the excess fluid to continue.  Reports that she has an upcoming visit with infectious disease to discuss the findings and any concerns that she has.  Reports that the plastic surgeons to have it if she continues to have issues with fluid reduction and seroma formation, they would likely have to remove the implants.  This is causing significant emotional upheaval as this was a gender affirming procedure.   Objective:    Vitals:   06/04/22 1611  BP: 129/78  Pulse: 76  Resp: 20  Height: 6' (1.829 m)  Weight: 199 lb 3.2 oz (90.4 kg)  SpO2: 98%  BMI (Calculated): 27.01    Physical Exam Vitals reviewed.  Constitutional:      General: She is not in acute distress.    Appearance: Normal appearance. She is not ill-appearing.  HENT:     Head: Normocephalic and atraumatic.  Cardiovascular:     Rate and Rhythm: Normal rate and regular rhythm.     Pulses: Normal pulses.     Heart sounds: Normal heart sounds.  Pulmonary:     Effort: Pulmonary effort is normal. No respiratory distress.     Breath sounds: Normal breath sounds. No wheezing, rhonchi or rales.  Skin:    General: Skin is warm and dry.     Comments:  Bilateral inner buttock wounds have healed beautifully and are now closed.  JP drains intact at the upper portion of each buttock.  The suture on the left JP drain was already broken.  The right JP drain suture was intact.  Neurological:     Mental Status: She is alert and oriented to person, place, and time.  Psychiatric:        Mood and Affect: Mood normal.        Behavior: Behavior normal.        Thought Content: Thought content normal.        Judgment: Judgment normal.   No results found for this or any previous visit (from the past 24 hour(s)).     The 10-year ASCVD risk score (Arnett DK, et al., 2019) is: 2.1%   Values used to calculate the score:     Age: 42 years     Sex: Male     Is Non-Hispanic African American: No     Diabetic: No     Tobacco smoker: Yes     Systolic Blood Pressure: [id number] mmHg     Is BP treated: No     HDL Cholesterol: 64 mg/dL     Total Cholesterol: 169 mg/dL   Assessment & Plan:   1. Wound drainage JP drain sites look good  with only a scant amount of crusted serous drainage present.  Both JP drains removed without difficulty.  Triple antibiotic ointment was applied to the sites with a sterile bandage to cover them.  Advised on site care with warm soapy water once daily and keep covered as they may still have some serous drainage.  Expectantly JP drain holes to close up on their own over the next several days.  Because she is significantly worried about the risk of infection versus inflammation, she would like to have some labs drawn today.  Discussed what labs will be appropriate so we are going ahead with CBC with differential, CRP, and ESR to evaluate for inflammation and potential elevations in white blood cell count.  Recommend continuing with her appointment with infectious disease.  Discussed the anxiety associated with the possible failure of her procedure.  This is only a temporary setback however if she begins to have trouble with severe anxiety, we  can certainly look at agents that could be helpful in managing this. - CBC with Differential/Platelet - Sed Rate (ESR) - C-reactive protein  Return if symptoms worsen or fail to improve.  ___________________________________________ Clearnce Sorrel, DNP, APRN, FNP-BC [facility] and [place]

## 2022-06-08 ENCOUNTER — Telehealth: Payer: Self-pay

## 2022-06-08 NOTE — Telephone Encounter (Signed)
Patient called concerned infection is back. Patient has been running a fever over the weekend, hasn't checked today. Highest temp was 102. Patient has spoke with her surgeon and was given rx for bactrim and flagyl. I told patient since she is having fevers it might be best to go to ER. Patient declined to go to ER. Patient adamant about seeing a provider. Patient scheduled with a provider on Wednesday 06/10/2022.   [place], CMA

## 2022-06-10 ENCOUNTER — Other Ambulatory Visit: Payer: Self-pay

## 2022-06-10 ENCOUNTER — Ambulatory Visit: Payer: 59 | Admitting: Internal Medicine

## 2022-06-10 ENCOUNTER — Encounter: Payer: Self-pay | Admitting: Internal Medicine

## 2022-06-10 VITALS — BP 129/85 | HR 67 | Temp 97.4°F | Ht 72.0 in | Wt 192.0 lb

## 2022-06-10 DIAGNOSIS — T8149XA Infection following a procedure, other surgical site, initial encounter: Secondary | ICD-10-CM | POA: Diagnosis not present

## 2022-06-10 NOTE — Progress Notes (Signed)
[facility] for Infectious Disease  CHIEF COMPLAINT:    Follow up for wound infection  SUBJECTIVE:    Hunter Lynch is a 42 y.o. adult with PMHx as below who presents to the clinic for concern about implant infection.   She underwent a bilateral buttock procedure and gluteal implants in [place] by her plastic surgeon in December with JP drains placed perioperatively. She developed some swelling at the surgical site and surgeon was concerned with infection. She saw her PCP in February and had cultures sent from the drains that grew MSSA (left buttock) and MRSA (right buttock).  She saw Dr Linus Salmons 2/28 as a new patient.  No signs of infection at that time.  Advised to stop antibiotics, but they ultimately continued antibiotics until completion at discretion of her surgeon.  She completed this course of Bactrim and Flagyl around 06/02/22.   Seen 3/14 by PCP office to have drains removed.  This was done without difficulty.  No signs of infection noted that date.   However, she called the office on 3/18 with reported concern that "infection is back".  Reported fever over the weekend of 102 with redness and tenderness along prior drain site.  There was also swelling noted particularly with left buttock.  She spoke with surgeon whom prescribed Bactrim and Flagyl x 10 more days.  She is here today with husband and reports improvement since resuming Bactrim and Flagyl in terms of fevers and redness.  There is still some tenderness and swelling of the left buttock compared to right.  The site where drain was removed is closed and not draining.  She is incredibly nauseous from the antibiotics and was prescribed Flagyl 500mg  q8h and Bactrim 2 DS tablets BID.   Please see A&P for the details of today's visit and status of the patient's medical problems.   Patient's Medications  New Prescriptions   No medications on file  Previous Medications   B-D SYRINGE LUER-LOK 1CC 1 ML MISC    INJECT 1 SYRINGE  UNDER THE SKIN ONE DAY A WEEK   CLOTRIMAZOLE-BETAMETHASONE (LOTRISONE) CREAM    Apply 1 application. topically daily.   ESTRADIOL VALERATE (DELESTROGEN) 20 MG/ML INJECTION    INJECT 1.5 ML (30 MG TOTAL) INTRAMUSCULARLY  ONCE A WEEK   GABAPENTIN (NEURONTIN) 300 MG CAPSULE    Take 1 capsule (300 mg total) by mouth every 8 (eight) hours as needed.   MEDROXYPROGESTERONE ACETATE 150 MG/ML SUSY    Inject 1 mL (150 mg total) into the muscle every 3 (three) months.   NEEDLE, DISP, 18 G 18G X 1" MISC    To draw estradiol valerate   NEEDLE, DISP, 25 G (BD DISP NEEDLES) 25G X 5/8" MISC    To inject estradiol valerate weekly   SULFAMETHOXAZOLE-TRIMETHOPRIM (BACTRIM DS) 800-160 MG TABLET    Take 1 tablet by mouth 2 (two) times daily.  Modified Medications   No medications on file  Discontinued Medications   METRONIDAZOLE (FLAGYL) 500 MG TABLET    Take 500 mg by mouth 3 (three) times daily.      Past Medical History:  Diagnosis Date   Anxiety    Back pain    Depression    DJD (degenerative joint disease)    Gender dysphoria    Kidney stones    Myofascial pain    Nicotine dependence with current use    Sciatica     Social History   Tobacco Use   Smoking  status: Former   Smokeless tobacco: Never  Scientific laboratory technician Use: Some days  Substance Use Topics   Alcohol use: No   Drug use: No    Family History  Problem Relation Age of Onset   Hypertension Father    Hypertension Paternal Uncle    Breast cancer Maternal Grandmother     Allergies  Allergen Reactions   Duloxetine Other (See Comments)    Sweating, anxiety   Erythromycin Nausea And Vomiting and Other (See Comments)    Review of Systems  All other systems reviewed and are negative.    OBJECTIVE:    Vitals:   06/10/22 1030  BP: 129/85  Pulse: 67  Temp: (!) 97.4 F (36.3 C)  TempSrc: Temporal  Weight: 192 lb (87.1 kg)  Height: 6' (1.829 m)   Body mass index is 26.04 kg/m.  Physical Exam Constitutional:       General: She is not in acute distress.    Appearance: Normal appearance.     Comments: Very pleasant and conversational.    HENT:     Head: Normocephalic and atraumatic.  Musculoskeletal:     Comments: Left buttock with no erythema and mild tenderness.  There is no drainage from prior drain site currently and Band Aid covering the site is dry.  There is some increased swelling on the left compared to the right .  Skin:    General: Skin is warm and dry.  Neurological:     General: No focal deficit present.     Mental Status: She is alert and oriented to person, place, and time.      Labs and Microbiology:    Latest Ref Rng & Units 11/19/2021    8:33 AM 04/23/2021    9:40 AM 08/31/2018    4:24 PM  CBC  WBC 3.8 - 10.8 Thousand/uL 5.8  8.5  8.4   Hemoglobin 13.2 - 17.1 g/dL 17.1  15.6  17.0   Hematocrit 38.5 - 50.0 % 51.1  46.3  48.2   Platelets 140 - 400 Thousand/uL 287  337  317       Latest Ref Rng & Units 11/19/2021    8:33 AM 04/23/2021    9:40 AM 08/31/2018    4:24 PM  CMP  Glucose 65 - 99 mg/dL 79  73  88   BUN 7 - 25 mg/dL 10  11  12    Creatinine 0.60 - 1.29 mg/dL 0.86  0.72  0.81   Sodium 135 - 146 mmol/L 140  139  140   Potassium 3.5 - 5.3 mmol/L 4.9  4.6  4.4   Chloride 98 - 110 mmol/L 104  105  105   CO2 20 - 32 mmol/L 27  27  27    Calcium 8.6 - 10.3 mg/dL 9.4  9.3  9.5   Total Protein 6.1 - 8.1 g/dL 7.0  7.0  6.6   Total Bilirubin 0.2 - 1.2 mg/dL 0.6  0.6  0.6   AST 10 - 40 U/L 17  15  20    ALT 9 - 46 U/L 15  13  15       No results found for this or any previous visit (from the past 240 hour(s)).     ASSESSMENT & PLAN:    Wound infection after surgery Patient here today with concerns for infection related to recent gluteal implants in December 2023.  She seems to have had a flare up post drain removal with fever and cellulitis  that is improving.  There is some continued swelling of unclear signficance.  She is on Bactrim and Flagyl day # 5 of 10 prescribed by  her surgeon.    Hopefully this was a superficial infection related to drain removal and not indicative of implants being infected.  She sees her surgeon again in mid-April in [place] where they will discuss removal if continued concern for infection.    Advised to finish current course of Bactrim and then observe.  If infection flares back up after completion, then would be concerned for implants infected and recommend removal.  Advised to stop Flagyl as it is likely contributing to significant nausea and unsure what benefit it is providing.  If this flares up again after current antibiotics, discussed the possibility of suppressive antibiotics for a few weeks pending implant removal.  If Bactrim continued beyond current course, would suggest checking BMET with this prolonged course to ensure no issues.  She will see Dr Linus Salmons on 06/26/22 for close follow up after completing antibiotics.    No orders of the defined types were placed in this encounter.       Hunter Lynch for Infectious Disease [facility] 06/10/2022, 11:52 AM   I have personally spent 40 minutes involved in face-to-face and non-face-to-face activities for this patient on the day of the visit. Professional time spent includes the following activities: Preparing to see the patient (review of tests), Obtaining and/or reviewing separately obtained history (admission/discharge record), Performing a medically appropriate examination and/or evaluation , Ordering medications/tests/procedures, referring and communicating with other health care professionals, Documenting clinical information in the EMR, Independently interpreting results (not separately reported), Communicating results to the patient/family/caregiver, Counseling and educating the patient/family/caregiver and Care coordination (not separately reported).

## 2022-06-10 NOTE — Assessment & Plan Note (Signed)
Patient here today with concerns for infection related to recent gluteal implants in December 2023.  She seems to have had a flare up post drain removal with fever and cellulitis that is improving.  There is some continued swelling of unclear signficance.  She is on Bactrim and Flagyl day # 5 of 10 prescribed by her surgeon.    Hopefully this was a superficial infection related to drain removal and not indicative of implants being infected.  She sees her surgeon again in mid-April in [place] where they will discuss removal if continued concern for infection.    Advised to finish current course of Bactrim and then observe.  If infection flares back up after completion, then would be concerned for implants infected and recommend removal.  Advised to stop Flagyl as it is likely contributing to significant nausea and unsure what benefit it is providing.  If this flares up again after current antibiotics, discussed the possibility of suppressive antibiotics for a few weeks pending implant removal.  If Bactrim continued beyond current course, would suggest checking BMET with this prolonged course to ensure no issues.  She will see Dr Linus Salmons on 06/26/22 for close follow up after completing antibiotics.

## 2022-06-17 ENCOUNTER — Encounter: Payer: Self-pay | Admitting: Internal Medicine

## 2022-06-26 ENCOUNTER — Other Ambulatory Visit: Payer: Self-pay

## 2022-06-26 ENCOUNTER — Encounter: Payer: Self-pay | Admitting: Internal Medicine

## 2022-06-26 ENCOUNTER — Ambulatory Visit: Payer: 59 | Admitting: Internal Medicine

## 2022-06-26 VITALS — BP 131/88 | HR 76 | Temp 97.9°F | Resp 16 | Wt 195.0 lb

## 2022-06-26 DIAGNOSIS — T8149XA Infection following a procedure, other surgical site, initial encounter: Secondary | ICD-10-CM | POA: Insufficient documentation

## 2022-06-26 DIAGNOSIS — Z5181 Encounter for therapeutic drug level monitoring: Secondary | ICD-10-CM | POA: Diagnosis not present

## 2022-06-26 NOTE — Progress Notes (Signed)
   Subjective:    Patient ID: Hunter Lynch, adult    DOB: 09/24/1980, 42 y.o.   MRN: [id number]  HPI Hunter Lynch is here for follow up of an infected implant.  Implants placed bilateral gluteal area and developed a post operative infection with JP drains in place and on prolonged antibiotics.  I saw her and appearance not c/w infection at that time and recommended stopping antibiotics.  She though continued antibiotics at the recommendation of the surgeon until completion and then drains were removed by her PCP with her implant area looking stable.  She returned though 3/20 with new concerns including warmth, fever, redness.  Took Bactrim and metronidazole again but nausea with the metronidazole so just on Bactrim.  After completing antibiotics, she felt much better and for 3 days could do all her usual activities but then flared up again and restarted the Bactrim.     Review of Systems  Constitutional:  Negative for fatigue.  Gastrointestinal:  Negative for diarrhea and nausea.  Skin:  Negative for rash.       Objective:   Physical Exam Eyes:     General: No scleral icterus. Pulmonary:     Effort: Pulmonary effort is normal.  Genitourinary:    Comments: Left buttock area with a erythematous area, firm, indurated, hot Skin:    Findings: No rash.  Neurological:     Mental Status: She is alert.   SH: no tobacco        Assessment & Plan:

## 2022-06-26 NOTE — Assessment & Plan Note (Signed)
Will check a bmp on Bactrim

## 2022-06-26 NOTE — Assessment & Plan Note (Addendum)
On exam, her buttock area is firm, indurated and most c/w underlying abscess.  I recommend she have an I and D of the area to relieve the pressure.  I suspect this is the signifcant pain she is having. She will call her PCP

## 2022-06-26 NOTE — Assessment & Plan Note (Signed)
At this point, failed conservative measures and treatment will require implant removal which is scheduled. She will remain on Bactrim until completion of surgery and I recommend she continue antibiotics for 5 days after removal.

## 2022-06-27 LAB — BASIC METABOLIC PANEL WITH GFR
BUN: 10 mg/dL (ref 7–25)
CO2: 24 mmol/L (ref 20–32)
Calcium: 9.3 mg/dL (ref 8.6–10.3)
Chloride: 103 mmol/L (ref 98–110)
Creat: 0.74 mg/dL (ref 0.60–1.29)
Glucose, Bld: 67 mg/dL (ref 65–99)
Potassium: 5 mmol/L (ref 3.5–5.3)
Sodium: 138 mmol/L (ref 135–146)

## 2022-06-30 ENCOUNTER — Ambulatory Visit: Payer: 59 | Admitting: Medical-Surgical

## 2022-06-30 ENCOUNTER — Encounter: Payer: Self-pay | Admitting: Medical-Surgical

## 2022-06-30 VITALS — BP 120/77 | HR 83 | Resp 20 | Ht 72.0 in | Wt 195.0 lb

## 2022-06-30 DIAGNOSIS — T148XXA Other injury of unspecified body region, initial encounter: Secondary | ICD-10-CM

## 2022-06-30 DIAGNOSIS — L089 Local infection of the skin and subcutaneous tissue, unspecified: Secondary | ICD-10-CM

## 2022-06-30 NOTE — Progress Notes (Signed)
        Established patient visit  History, exam, impression, and plan:  1. Wound infection Hunter Lynch 42 year old transgender male accompanied by her husband presenting today for further evaluation of postsurgical complications.  In the middle of December 2023[place] with bilateral implants.  Shortly after her initial recovery, she developed complications.  She has been in close communication with her plastic surgeon and was referred to infectious disease for management.  Was treated long-term with oral antibiotics until infectious disease advised her to stop them.  She was doing better until a couple of days after her JP drains were removed.  After that, she developed worsening swelling, redness, pain, etc.  She has been following with infectious disease as recommended and is currently back on double strength Bactrim.  Unable to tolerate Flagyl.  Saw infectious disease on 06/26/2022 where they noted a left buttock abscess at the site of the prior JP drain along with redness and induration along the buttock.  It was recommended she have an I&D of the underlying abscess.  Per their note, she was to contact her PCP about this.  Today, she presents with continued clear yellow serous drainage from the left-sided JP drain site.  Granulomatous tissue at the site visible.  A large area of erythematous induration noted along the gluteal crease.  On exam, no significant area of fluctuance noted in the erythematous area.  At the insertion site, some concern for underlying abscess as the granulomatous tissue is soft and clear serous fluid visibly draining.  Discussed risks versus benefits with patient.  She would like to attempt an I&D for pain control.  Area cleaned with chlorhexidine and anesthetized with 1 cc of 1% lidocaine with epinephrine.  A #11 blade was used to place a small incision at the JP drain site.  Small amount of blood return noted but no purulent or serous  drainage expressible.  Hemostasis achieved with pressure and a sterile gauze was placed over the site with a large Band-Aid.  Given the area of induration and discomfort, need imaging to determine the extent of the abscess/cellulitis.  Some concern for deep abscess that may require sooner surgical intervention or IV antibiotics.  Ordering CT of the pelvis with contrast stat for immediate evaluation. - CT PELVIS W CONTRAST; Future  Procedures performed this visit: None.  Return if symptoms worsen or fail to improve.  __________________________________ Thayer Ohm, DNP, APRN, FNP-BC Primary Care and Sports Medicine [facility] [place]

## 2022-07-01 ENCOUNTER — Encounter: Payer: Self-pay | Admitting: Medical-Surgical

## 2022-08-14 ENCOUNTER — Ambulatory Visit: Payer: 59 | Admitting: Medical-Surgical

## 2022-08-14 ENCOUNTER — Encounter: Payer: Self-pay | Admitting: Medical-Surgical

## 2022-08-14 VITALS — BP 110/68 | HR 76 | Resp 20 | Ht 72.0 in | Wt 191.3 lb

## 2022-08-14 DIAGNOSIS — Z13220 Encounter for screening for lipoid disorders: Secondary | ICD-10-CM

## 2022-08-14 DIAGNOSIS — F419 Anxiety disorder, unspecified: Secondary | ICD-10-CM

## 2022-08-14 DIAGNOSIS — R5383 Other fatigue: Secondary | ICD-10-CM | POA: Diagnosis not present

## 2022-08-14 DIAGNOSIS — Z789 Other specified health status: Secondary | ICD-10-CM

## 2022-08-14 DIAGNOSIS — Z4889 Encounter for other specified surgical aftercare: Secondary | ICD-10-CM

## 2022-08-14 DIAGNOSIS — F332 Major depressive disorder, recurrent severe without psychotic features: Secondary | ICD-10-CM

## 2022-08-14 LAB — CBC WITH DIFFERENTIAL/PLATELET
Hemoglobin: 14.9 g/dL (ref 13.2–17.1)
Lymphs Abs: 1504 cells/uL (ref 850–3900)
MCH: 29.2 pg (ref 27.0–33.0)
MCV: 89.2 fL (ref 80.0–100.0)
MPV: 9.3 fL (ref 7.5–12.5)
Monocytes Relative: 9.5 %

## 2022-08-14 MED ORDER — BUSPIRONE HCL 5 MG PO TABS: 5.0000 mg | ORAL_TABLET | Freq: Two times a day (BID) | ORAL | 3 refills | Status: DC

## 2022-08-14 MED ORDER — SERTRALINE HCL 50 MG PO TABS: ORAL_TABLET | ORAL | 1 refills | Status: DC

## 2022-08-14 MED ORDER — ESTRADIOL 0.5 MG PO TABS: 0.5000 mg | ORAL_TABLET | Freq: Every day | ORAL | 3 refills | Status: DC

## 2022-08-14 MED ORDER — ONDANSETRON 4 MG PO TBDP: 4.0000 mg | ORAL_TABLET | Freq: Three times a day (TID) | ORAL | 2 refills | Status: DC | PRN

## 2022-08-14 NOTE — Progress Notes (Signed)
Established patient visit  History, exam, impression, and plan:  1. Male-to-male transgender person Pleasant 42 year old transgender male presenting today for follow-up on HRT.  Has been off of hormone replacement therapy due to surgery and postoperative complications with chronic/prolonged antibiotic therapy.  Interested in having her labs checked today.  Reports that she actually feels better off of the higher doses of estrogen and would like to start back but at the lowest dose possible.  Labs ordered.  Discussed various options.  We will start back with Estrace 0.5 mg orally once daily. - COMPLETE METABOLIC PANEL WITH GFR - Testosterone - Estradiol  2. Fatigue, unspecified type Has had significant worsening of fatigue lately.  Is not sure if this is related to postsurgical status, prolonged antibiotic use, laboratory abnormality, or medication side effect.  Would like to have lab work done for further evaluation.  Orders placed. - CBC with Differential/Platelet - COMPLETE METABOLIC PANEL WITH GFR - TSH - Hemoglobin A1c - Iron, TIBC and Ferritin Panel - VITAMIN D 25 Hydroxy (Vit-D Deficiency, Fractures)  3. Lipid screening Checking lipid panel. - Lipid panel  4. Encounter for postoperative wound check As noted above, has had prolonged antibiotic therapy for cellulitis and postoperative complications.  Has continued the antibiotics after her recent surgery per the surgeons recommendations after reviewing images that were sent.  At that time, he felt that she still had some cellulitis going on in the buttock area.  Today she presents with reports that she has been monitoring the wounds closely and would like evaluation to determine if she needs to continue with the antibiotics.  On exam, her wounds appear to be healing well and the redness noted on previous images has resolved.  She does have a JP drain in place to the right buttock and there is some redness at the suture  securing it.  There is a small opening where the previous JP drain was at the gluteal cleft but there is no significant erythema, drainage, or fluctuance.  Appearance of wounds is overall reassuring with no indications for continued cellulitis.  Advised her to reach out to her surgeon with the most recent updated picture to see what his thoughts are but feel that a trial off antibiotics would be appropriate.  Advised her to stop the antibiotics but monitor her symptoms over the next 2 to 3 days as she is quick to develop symptoms of infection.  If concerning symptoms return, restart antibiotics and contact her surgeon for further recommendations.  5. Severe episode of recurrent major depressive disorder, without psychotic features (HCC) 6. Anxiety With the multiple surgeries, associated complications, and other stressors, she is struggling tremendously with depression and anxiety.  Has a long history of mental health concerns but is usually able to manage these without medications.  She is currently doing counseling weekly but will be starting with a new counselor next week.  Not currently on any mood management medications.  Has tried Seroquel, gabapentin, Lexapro, Wellbutrin, and Zoloft in the past.  Admits that she tolerated Zoloft well.  With the changes in her body, she has had a worsening of the gender dysphoria.  Overall feels very stressed out and is now waking in the middle of the night with panic attacks.  Has been irritable and this is starting to cause issues and her marriage.  She has had thoughts regularly about self-harm/suicidal ideation but has no plans to carry this out.  Denies homicidal ideation.  After  review of options, she is agreeable to restart Zoloft.  Starting at 25 mg daily for 1 week then increasing to 50 mg daily.  Adding BuSpar 5-15 mg twice daily.  Recommend continuing frequent counseling.  Contracted for safety today and is aware to reach out to emergency psychological resources  should her thoughts of self-harm and suicidal ideation worsen.  Procedures performed this visit: None.  Return in about 4 weeks (around 09/11/2022) for mood follow up.  __________________________________ Thayer Ohm, DNP, APRN, FNP-BC Primary Care and Sports Medicine [facility] [place]

## 2022-08-15 LAB — CBC WITH DIFFERENTIAL/PLATELET
Absolute Monocytes: 922 cells/uL (ref 200–950)
Basophils Absolute: 49 cells/uL (ref 0–200)
Basophils Relative: 0.5 %
Eosinophils Absolute: 243 cells/uL (ref 15–500)
Eosinophils Relative: 2.5 %
HCT: 45.5 % (ref 38.5–50.0)
MCHC: 32.7 g/dL (ref 32.0–36.0)
Neutro Abs: 6984 cells/uL (ref 1500–7800)
Neutrophils Relative %: 72 %
Platelets: 316 10*3/uL (ref 140–400)
RBC: 5.1 10*6/uL (ref 4.20–5.80)
RDW: 14.8 % (ref 11.0–15.0)
Total Lymphocyte: 15.5 %
WBC: 9.7 10*3/uL (ref 3.8–10.8)

## 2022-08-15 LAB — ESTRADIOL: Estradiol: 22 pg/mL (ref <=39)

## 2022-08-15 LAB — LIPID PANEL
Cholesterol: 162 mg/dL (ref <200)
HDL: 51 mg/dL (ref >=40)
LDL Cholesterol (Calc): 85 mg/dL (calc)
Non-HDL Cholesterol (Calc): 111 mg/dL (calc) (ref <130)
Total CHOL/HDL Ratio: 3.2 (calc) (ref <5.0)
Triglycerides: 164 mg/dL — ABNORMAL HIGH (ref <150)

## 2022-08-15 LAB — COMPLETE METABOLIC PANEL WITH GFR
AG Ratio: 2.1 (calc) (ref 1.0–2.5)
ALT: 16 U/L (ref 9–46)
AST: 16 U/L (ref 10–40)
Albumin: 4.8 g/dL (ref 3.6–5.1)
Alkaline phosphatase (APISO): 72 U/L (ref 36–130)
BUN: 13 mg/dL (ref 7–25)
CO2: 26 mmol/L (ref 20–32)
Calcium: 9.7 mg/dL (ref 8.6–10.3)
Chloride: 103 mmol/L (ref 98–110)
Creat: 0.97 mg/dL (ref 0.60–1.29)
Globulin: 2.3 g/dL (calc) (ref 1.9–3.7)
Glucose, Bld: 81 mg/dL (ref 65–99)
Potassium: 4.2 mmol/L (ref 3.5–5.3)
Sodium: 139 mmol/L (ref 135–146)
Total Bilirubin: 0.3 mg/dL (ref 0.2–1.2)
Total Protein: 7.1 g/dL (ref 6.1–8.1)
eGFR: 101 mL/min/{1.73_m2} (ref >=60)

## 2022-08-15 LAB — IRON,TIBC AND FERRITIN PANEL
%SAT: 18 % (calc) — ABNORMAL LOW (ref 20–48)
Ferritin: 46 ng/mL (ref 38–380)
Iron: 56 ug/dL (ref 50–180)
TIBC: 318 mcg/dL (calc) (ref 250–425)

## 2022-08-15 LAB — HEMOGLOBIN A1C
Hgb A1c MFr Bld: 5.1 % of total Hgb (ref <5.7)
Mean Plasma Glucose: 100 mg/dL
eAG (mmol/L): 5.5 mmol/L

## 2022-08-15 LAB — TSH: TSH: 2.18 mIU/L (ref 0.40–4.50)

## 2022-08-15 LAB — VITAMIN D 25 HYDROXY (VIT D DEFICIENCY, FRACTURES): Vit D, 25-Hydroxy: 23 ng/mL — ABNORMAL LOW (ref 30–100)

## 2022-08-15 LAB — TESTOSTERONE: Testosterone: 466 ng/dL (ref 250–827)

## 2022-09-14 ENCOUNTER — Ambulatory Visit: Payer: 59 | Admitting: Medical-Surgical

## 2022-09-14 ENCOUNTER — Ambulatory Visit: Payer: 59 | Admitting: Sports Medicine

## 2022-10-02 ENCOUNTER — Encounter: Payer: Self-pay | Admitting: Medical-Surgical

## 2022-10-02 ENCOUNTER — Telehealth (INDEPENDENT_AMBULATORY_CARE_PROVIDER_SITE_OTHER): Payer: 59 | Admitting: Medical-Surgical

## 2022-10-02 DIAGNOSIS — F419 Anxiety disorder, unspecified: Secondary | ICD-10-CM | POA: Diagnosis not present

## 2022-10-02 DIAGNOSIS — R030 Elevated blood-pressure reading, without diagnosis of hypertension: Secondary | ICD-10-CM

## 2022-10-02 DIAGNOSIS — F332 Major depressive disorder, recurrent severe without psychotic features: Secondary | ICD-10-CM | POA: Diagnosis not present

## 2022-10-02 MED ORDER — METOPROLOL SUCCINATE ER 25 MG PO TB24: 25.0000 mg | ORAL_TABLET | Freq: Every day | ORAL | 3 refills | Status: DC

## 2022-10-02 NOTE — Progress Notes (Signed)
Virtual Visit via Video Note  I connected with Hunter Lynch on 10/02/22 at  1:00 PM EDT by a video enabled telemedicine application and verified that I am speaking with the correct person using two identifiers.   I discussed the limitations of evaluation and management by telemedicine and the availability of in person appointments. The patient expressed understanding and agreed to proceed.  Patient location: home Provider locations: office  Subjective:    CC: Blood pressure follow-up  HPI: Pleasant 42 year old transgender male presenting today via MyChart video visit for follow-up on blood pressure and requesting refills of metoprolol.  She has been doing well overall recently but had surgery again on Tuesday to revise her BBL and liposuction with fat transfer.  She is stuck at home and unable to drive on her own just yet but should be able to get back to regular activities in 4 to 6 weeks.  Notes that she has been taking the metoprolol as prescribed, tolerating well without side effects.  Blood pressure has been looking great and she has had no concerning symptoms.  Has been placed back on antibiotics after her surgery due to the prior infection issues.  Notes that the antibiotics do tend to affect her mood negatively and she has been struggling over the last several weeks.  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  This was regarding pain medications and she notes it was over 1 day.  She quickly pulled herself back together and reconnected with a sponsor and support services.  She did stop taking Zoloft and only took BuSpar for a couple of days before stopping that as well.  Feels like she is in a better mental place now but it has been rough over the last few weeks.  Denies SI/HI. Surgery on Tuesday, redid bbl and fat transfer/lipo Back on abt and now feeling down/depressed Not on mood meds Relapsed a few weeks ago with  pain med, has a sponsor and is back on the horse    Past medical history, Surgical history, Family history not pertinant except as noted below, Social history, Allergies, and medications have been entered into the medical record, reviewed, and corrections made.   Review of Systems: See HPI for pertinent positives and negatives.   Objective:    General: Speaking clearly in complete sentences without any shortness of breath.  Alert and oriented x3.  Normal judgment. No apparent acute distress.  Impression and Recommendations:    1. Elevated blood pressure reading Blood pressure has been doing great at home and she is doing well on the medication.  Continue Toprol-XL 25 mg daily as prescribed.  Monitor blood pressure and heart rate at home with a goal of 130/80 or less and heart rate of 60-99.  2. Severe episode of recurrent major depressive disorder, without psychotic features (HCC) 3. Anxiety Medication list updated.  Seems to be doing a bit better mentally and is not interested in continue medications.  Has her support group and sponsor as a resource as well as her husband.  Advised her to reach out should she have worsening of symptoms or thoughts of SI/HI and we can certainly develop a treatment plan at that time.  I discussed the assessment and treatment plan with the patient. The patient was provided an opportunity to ask questions and all were answered. The patient agreed with the plan and demonstrated an understanding of the instructions.   The patient  was advised to call back or seek an in-person evaluation if the symptoms worsen or if the condition fails to improve as anticipated.  20 minutes of non-face-to-face time was provided during this encounter.  Return if symptoms worsen or fail to improve.  Further follow-up once she is able to drive and make it to the office for appointments.  She will call to schedule.  Thayer Ohm, DNP, APRN, FNP-BC [facility]  [facility] and Sports Medicine

## 2022-12-28 ENCOUNTER — Ambulatory Visit: Payer: 59 | Admitting: Medical-Surgical

## 2022-12-28 ENCOUNTER — Telehealth: Payer: Self-pay | Admitting: Medical-Surgical

## 2022-12-28 ENCOUNTER — Encounter: Payer: Self-pay | Admitting: Medical-Surgical

## 2022-12-28 VITALS — BP 110/73 | HR 73 | Resp 20 | Ht 72.0 in | Wt 205.2 lb

## 2022-12-28 DIAGNOSIS — Z4802 Encounter for removal of sutures: Secondary | ICD-10-CM | POA: Diagnosis not present

## 2022-12-28 MED ORDER — FINASTERIDE-MINOXIDIL 0.1-5 % EX SOLN: CUTANEOUS | 5 refills | Status: DC

## 2022-12-28 NOTE — Progress Notes (Signed)
        Established patient visit  History, exam, impression, and plan:  1. Visit for suture removal Pleasant 42 year old transgender male presenting today for suture removal after having a hair transplant procedure approximately 12 days ago.  The procedure went well and she has been healing well since then.  No bleeding, drainage, swelling, erythema, or unusual pain to the site.  She is nervous as the last time she had this done, it took 2 days to get the sutures out and it was extremely painful.  She did premedicate with gabapentin and Norco.  On exam, she has an incision line from the back of her right ear extending around occipital bleed to the back of the left ear.  Edges well-approximated, minimal scabbing.  Clear suture material noted running in 1 continuous running stitch.  Sutures removed with mild difficulty.  Some pain during the procedure but this was manageable.  Okay to wash the area with shampoo then pat dry.  Monitor for signs or symptoms of infection.  Procedures performed this visit: None.  Return if symptoms worsen or fail to improve.  __________________________________ Thayer Ohm, DNP, APRN, FNP-BC Primary Care and Sports Medicine [facility] [place]

## 2022-12-28 NOTE — Telephone Encounter (Signed)
[facility] pharmacy called in stating that Finasteride-Minoxidil 0.1-5 % SOLN is not something that they carry commercially. If there is another recommendation you recommend, please send.

## 2022-12-29 MED ORDER — FINASTERIDE-MINOXIDIL 0.1-5 % EX SOLN: CUTANEOUS | 5 refills | Status: DC

## 2022-12-29 NOTE — Telephone Encounter (Signed)
Pres[facility] in [place].  I do not know if they will be able to make this particular prescription but it is worth a try. Thanks, Ander Slade

## 2022-12-29 NOTE — Telephone Encounter (Signed)
Called patient to ask, she states that sending it to compounding pharmacy is fine.

## 2023-01-08 ENCOUNTER — Telehealth: Payer: Self-pay | Admitting: Medical-Surgical

## 2023-01-08 NOTE — Telephone Encounter (Signed)
Patient called stating her pharmacy does not have Finasteride-Minoxidil 0.1-5 % SOLN [[id number]] pt is requesting to change this rx to only Finasteride topical.

## 2023-01-14 NOTE — Telephone Encounter (Signed)
Unfortunately, I don't have the option to prescribe the topical finasteride alone. I can only send it as the combination.

## 2023-01-29 ENCOUNTER — Encounter: Payer: Self-pay | Admitting: Family Medicine

## 2023-01-29 ENCOUNTER — Ambulatory Visit: Payer: 59 | Admitting: Family Medicine

## 2023-01-29 ENCOUNTER — Ambulatory Visit: Payer: 59 | Admitting: Medical-Surgical

## 2023-01-29 VITALS — BP 130/85 | HR 72 | Temp 98.5°F | Resp 18 | Ht 72.0 in | Wt 207.4 lb

## 2023-01-29 DIAGNOSIS — M79671 Pain in right foot: Secondary | ICD-10-CM

## 2023-01-29 NOTE — Progress Notes (Addendum)
Acute Office Visit  Subjective:     Patient ID: Hunter Lynch, adult    DOB: February 24, 1981, 42 y.o.   MRN: [id number]  Chief Complaint  Patient presents with   Foot Injury    Patient states that she has been having trouble walking on her right foot for the past month, she states that it turns in and she feels unbalanced at times . She states that she has a pain on the top of her foot on the edge.     Foot Injury   Patient is in today for acute visit.  Right foot pain She reports hx of surgeries in the last year including liposuction, breast implant removal and hair transplant. She reports as a result she is gaining weight. She reports hx of wearing out her shoes. She has right foot shoes that are leaning over to the right. She never had pain in her foot. She reports she was out of work for 9 months for the surgeries. She went back to work a month ago at [employer] and reports she has issues standing for a long period of time. She reports the top of her foot hurts. She has tried tylenol and ibuprofen prn for the foot pain. She is on Gabapentin 300mg  TID but doesn't take this 3 times a day. She uses this for anxiety and panic attacks.   She had breast implant removal 3 weeks ago. She wanted the incision scars checked today. She declines flu vaccine.  Review of Systems  Musculoskeletal:  Positive for joint pain.       Right foot pain  All other systems reviewed and are negative.       Objective:    BP 130/85   Pulse 72   Temp 98.5 F (36.9 C) (Oral)   Resp 18   Ht 6' (1.829 m)   Wt 207 lb 6.4 oz (94.1 kg)   SpO2 99%   BMI 28.13 kg/m    Physical Exam Vitals and nursing note reviewed.  Constitutional:      Appearance: Normal appearance. She is normal weight.  HENT:     Head: Normocephalic and atraumatic.     Right Ear: External ear normal.     Left Ear: External ear normal.     Nose: Nose normal.     Mouth/Throat:     Mouth: Mucous membranes are moist.      Pharynx: Oropharynx is clear.  Eyes:     Conjunctiva/sclera: Conjunctivae normal.     Pupils: Pupils are equal, round, and reactive to light.  Cardiovascular:     Rate and Rhythm: Normal rate.     Comments: Bilateral healed incision scars from breast implant removal Pulmonary:     Effort: Pulmonary effort is normal.  Skin:    General: Skin is warm.     Capillary Refill: Capillary refill takes less than 2 seconds.  Neurological:     General: No focal deficit present.     Mental Status: She is alert and oriented to person, place, and time. Mental status is at baseline.  Psychiatric:        Mood and Affect: Mood normal.        Behavior: Behavior normal.        Thought Content: Thought content normal.        Judgment: Judgment normal.    No results found for any visits on 01/29/23.      Assessment & Plan:   Problem List Items Addressed This  Visit   None Visit Diagnoses     Right foot pain    -  Primary   Relevant Orders   DG Foot Complete Right     Right foot pain -     DG Foot Complete Right; Future   Chronic right foot pain, worse with standing for long periods of time. Needs supportive shoes. Send for xray of right foot and to follow up pending results. To discuss with PCP on Gabapentin dosage adjustment. Also given pt recommendations on going to get custom made shoes.  No orders of the defined types were placed in this encounter.   No follow-ups on file.  Suzan Slick, MD

## 2023-01-29 NOTE — Patient Instructions (Addendum)
[facility] [address], [address] and [place]

## 2023-02-05 ENCOUNTER — Ambulatory Visit: Payer: 59

## 2023-02-05 ENCOUNTER — Ambulatory Visit: Payer: 59 | Admitting: Medical-Surgical

## 2023-02-05 ENCOUNTER — Encounter: Payer: Self-pay | Admitting: Medical-Surgical

## 2023-02-05 VITALS — BP 118/78 | HR 77 | Resp 20 | Ht 72.0 in | Wt 207.3 lb

## 2023-02-05 DIAGNOSIS — M79671 Pain in right foot: Secondary | ICD-10-CM

## 2023-02-05 DIAGNOSIS — B078 Other viral warts: Secondary | ICD-10-CM | POA: Diagnosis not present

## 2023-02-05 DIAGNOSIS — Z0289 Encounter for other administrative examinations: Secondary | ICD-10-CM

## 2023-02-05 MED ORDER — GABAPENTIN 300 MG PO CAPS: 300.0000 mg | ORAL_CAPSULE | Freq: Four times a day (QID) | ORAL | 5 refills | Status: DC | PRN

## 2023-02-05 NOTE — Progress Notes (Signed)
        Established patient visit  History, exam, impression, and plan:  1. Right foot pain Very pleasant 42 year old patient presenting today with reports of right foot pain.  This has been going on since she went back to work after a prolonged period out for surgery and surgical complications.  Was seen last week by a different provider but wanted to be reevaluated by her PCP today.  Reports that the foot is painful along the top with prolonged standing.  Has not been able to find anything that helps with her symptoms.  Also feels like her foot is turning inward.  The gabapentin that she has been taking does help a little bit with the pain but has not been able to find anything that prevents it.  On evaluation, no palpable abnormality today although there is some tenderness along the top of the foot.  Plan to get right foot x-rays today.  Referring to podiatry for further evaluation per patient request. - DG Foot Complete Right; Future - Ambulatory referral to Podiatry  2. Other viral warts Has a couple of lesions on her left finger and 1 on the bottom of her left third toe.  These have been there for a while but are becoming annoying.  Has tried using Compound W on them but interested in other definitive option.  Cryotherapy performed today.  See procedure note below.  3. Encounter for completion of form with patient Has been having difficulty with the short-term/long-term disability company and the required paperwork.  Has a period of 1 week that they are bickering over and she is having trouble going between all the providers involved and the disability company.  Would like to have forms sent to me as it is easier for her to manage communication with our office rather than multiple providers who are out of state.  Advised her to have them sent over and I will complete them.  Procedures performed this visit: Cryotherapy template Procedure: Cryodestruction of: 3 warts, one on the left foot, 2 on  the left hand Consent obtained and verified. Time-out conducted. Noted no overlying erythema, induration, or other signs of local infection. Completed without difficulty using Cryo-Gun. Advised to call if fevers/chills, erythema, induration, drainage, or persistent bleeding.   Return if symptoms worsen or fail to improve.  __________________________________ Thayer Ohm, DNP, APRN, FNP-BC Primary Care and Sports Medicine [facility] [place]

## 2023-02-08 ENCOUNTER — Encounter: Payer: Self-pay | Admitting: Medical-Surgical

## 2023-03-03 ENCOUNTER — Other Ambulatory Visit: Payer: Self-pay | Admitting: Podiatry

## 2023-03-03 DIAGNOSIS — M79671 Pain in right foot: Secondary | ICD-10-CM

## 2023-03-05 ENCOUNTER — Ambulatory Visit: Payer: 59

## 2023-03-05 ENCOUNTER — Encounter: Payer: Self-pay | Admitting: Podiatry

## 2023-03-05 ENCOUNTER — Ambulatory Visit: Payer: 59 | Admitting: Podiatry

## 2023-03-05 DIAGNOSIS — M79671 Pain in right foot: Secondary | ICD-10-CM

## 2023-03-05 DIAGNOSIS — M5416 Radiculopathy, lumbar region: Secondary | ICD-10-CM

## 2023-03-05 DIAGNOSIS — M19071 Primary osteoarthritis, right ankle and foot: Secondary | ICD-10-CM | POA: Diagnosis not present

## 2023-03-05 NOTE — Progress Notes (Signed)
  Subjective:  Patient ID: Hunter Lynch, adult    DOB: [birth year],   MRN: [id number]  No chief complaint on file.   42 y.o. adult presents for concern of right foot pain that has been going on for about 3 months. She has undergone some surgeries lately and been off her feet but recently started back at  work and that's when the pain started. Works at [employer]. Relates a significant history of back issues and pain and was in pain management for a while but has weaned herself off of all of this.  Denies any other pedal complaints. Denies n/v/f/c.   Past Medical History:  Diagnosis Date   Anxiety    Back pain    Depression    DJD (degenerative joint disease)    Gender dysphoria    Kidney stones    Myofascial pain    Nicotine dependence with current use    Sciatica     Objective:  Physical Exam: Vascular: DP/PT pulses 2/4 bilateral. CFT <3 seconds. Normal hair growth on digits. No edema.  Skin. No lacerations or abrasions bilateral feet.  Musculoskeletal: MMT 5/5 bilateral lower extremities in DF, PF, Inversion and Eversion. Deceased ROM in DF of ankle joint. Tender mildly to the dorsum of the midfoot area around second TMTJ. No pain around hallux and first MPJ. No pain with ROM of the first MPJ or first TMTJ some dicomfort with the second TMTJ. 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  Neurological: Sensation intact to light touch.   Assessment:   1. Arthritis of right midfoot   2. Lumbar radiculopathy      Plan:  Patient was evaluated and treated and all questions answered. -Xrays reviewed. No acute fractures or dislocations. Squaring off of the first metatarsal head with some joint space narrowing noted.  -Discussed midfoot arthritis vs radiculopathy and  treatement options; conservative and  Surgical management; risks, benefits, alternatives discussed. All patient's questions answered. -Reviewed notes from previous visits.  -Discussed natural remedies would like to avoid  medications.  - Discussed injection in future if needed.  -Recommend continue with good supportive shoes and inserts. Discussed stiff soled shoes and use of inserts -Patient to return to office as needed or sooner if condition worsens.   Louann Sjogren, DPM

## 2023-05-11 ENCOUNTER — Ambulatory Visit: Payer: 59 | Admitting: Medical-Surgical

## 2023-05-11 ENCOUNTER — Encounter: Payer: Self-pay | Admitting: Medical-Surgical

## 2023-05-11 VITALS — BP 151/91 | HR 87 | Resp 20 | Ht 72.0 in | Wt 213.7 lb

## 2023-05-11 DIAGNOSIS — R5383 Other fatigue: Secondary | ICD-10-CM

## 2023-05-11 DIAGNOSIS — R5381 Other malaise: Secondary | ICD-10-CM

## 2023-05-11 DIAGNOSIS — Z789 Other specified health status: Secondary | ICD-10-CM | POA: Diagnosis not present

## 2023-05-11 NOTE — Progress Notes (Unsigned)
        Established patient visit  History, exam, impression, and plan:  1. Malaise and fatigue (Primary) Hunter Lynch presents today with concerns regarding unusual malaise and fatigue that has been going on recently.  Has a history of fatigue however this is different.  Feels a sensation of vibration inside her body and is not sure of the cause.  Having difficulty resting which is not helping.  Feels that her mood is adequately controlled at this time and that the symptoms are not related to anxiety/depression.  Does admit to having a lot going on.  Had 6 cosmetic surgeries over the last year due to complications and does not feel like she is ever fully recovered.  She also has some family concerns with sick relatives that need care.  History of iron and vitamin deficiency and is not currently taking supplementation.  Unclear etiology.  This seems to be multifactorial.  Plan to check labs to rule out metabolic cause.  Encouraged self-care and working on getting adequate sleep to help offset the symptoms.  Once labs have resulted, we will make a further plan. - CBC with Differential/Platelet - CMP14+EGFR - Iron, TIBC and Ferritin Panel - Lipid panel - TSH - T4, free - VITAMIN D 25 Hydroxy (Vit-D Deficiency, Fractures) - Sed Rate (ESR) - C-reactive protein - ANA - Testosterone - Estradiol - Hemoglobin A1c  2. Male-to-male transgender person Well-documented history of gender affirming care.  Prescribed estradiol 0.5 mg daily and has been tolerating that well.  Reports feeling better on the lower dose.  Plan to recheck labs today. - Testosterone - Estradiol   Procedures performed this visit: None.  Return if symptoms worsen or fail to improve.  __________________________________ Hunter Ohm, DNP, APRN, FNP-BC Primary Care and Sports Medicine [facility] [place]

## 2023-05-14 ENCOUNTER — Encounter: Payer: Self-pay | Admitting: Medical-Surgical

## 2023-05-14 LAB — CBC WITH DIFFERENTIAL/PLATELET
Basophils Absolute: 0.1 10*3/uL (ref 0.0–0.2)
Basos: 1 %
EOS (ABSOLUTE): 0.2 10*3/uL (ref 0.0–0.4)
Eos: 3 %
Hematocrit: 50.1 % (ref 37.5–51.0)
Hemoglobin: 16.9 g/dL (ref 13.0–17.7)
Immature Grans (Abs): 0 10*3/uL (ref 0.0–0.1)
Immature Granulocytes: 0 %
Lymphocytes Absolute: 1.9 10*3/uL (ref 0.7–3.1)
Lymphs: 30 %
MCH: 30.1 pg (ref 26.6–33.0)
MCHC: 33.7 g/dL (ref 31.5–35.7)
MCV: 89 fL (ref 79–97)
Monocytes Absolute: 0.5 10*3/uL (ref 0.1–0.9)
Monocytes: 7 %
Neutrophils Absolute: 3.8 10*3/uL (ref 1.4–7.0)
Neutrophils: 59 %
Platelets: 331 10*3/uL (ref 150–450)
RBC: 5.62 x10E6/uL (ref 4.14–5.80)
RDW: 12.4 % (ref 11.6–15.4)
WBC: 6.4 10*3/uL (ref 3.4–10.8)

## 2023-05-14 LAB — CMP14+EGFR
ALT: 16 [IU]/L (ref 0–44)
AST: 19 [IU]/L (ref 0–40)
Albumin: 4.6 g/dL (ref 4.1–5.1)
Alkaline Phosphatase: 87 [IU]/L (ref 44–121)
BUN/Creatinine Ratio: 12 (ref 9–20)
BUN: 10 mg/dL (ref 6–24)
Bilirubin Total: 0.5 mg/dL (ref 0.0–1.2)
CO2: 26 mmol/L (ref 20–29)
Calcium: 9.6 mg/dL (ref 8.7–10.2)
Chloride: 105 mmol/L (ref 96–106)
Creatinine, Ser: 0.86 mg/dL (ref 0.76–1.27)
Globulin, Total: 2.1 g/dL (ref 1.5–4.5)
Glucose: 79 mg/dL (ref 70–99)
Potassium: 4.8 mmol/L (ref 3.5–5.2)
Sodium: 144 mmol/L (ref 134–144)
Total Protein: 6.7 g/dL (ref 6.0–8.5)
eGFR: 111 mL/min/{1.73_m2} (ref >59)

## 2023-05-14 LAB — IRON,TIBC AND FERRITIN PANEL
Ferritin: 47 ng/mL (ref 30–400)
Iron Saturation: 32 % (ref 15–55)
Iron: 110 ug/dL (ref 38–169)
Total Iron Binding Capacity: 341 ug/dL (ref 250–450)
UIBC: 231 ug/dL (ref 111–343)

## 2023-05-14 LAB — LIPID PANEL
Chol/HDL Ratio: 5 {ratio} (ref 0.0–5.0)
Cholesterol, Total: 196 mg/dL (ref 100–199)
HDL: 39 mg/dL — ABNORMAL LOW (ref >39)
LDL Chol Calc (NIH): 120 mg/dL — ABNORMAL HIGH (ref 0–99)
Triglycerides: 211 mg/dL — ABNORMAL HIGH (ref 0–149)
VLDL Cholesterol Cal: 37 mg/dL (ref 5–40)

## 2023-05-14 LAB — HEMOGLOBIN A1C
Est. average glucose Bld gHb Est-mCnc: 105 mg/dL
Hgb A1c MFr Bld: 5.3 % (ref 4.8–5.6)

## 2023-05-14 LAB — TESTOSTERONE: Testosterone: 298 ng/dL (ref 264–916)

## 2023-05-14 LAB — VITAMIN D 25 HYDROXY (VIT D DEFICIENCY, FRACTURES): Vit D, 25-Hydroxy: 20.7 ng/mL — ABNORMAL LOW (ref 30.0–100.0)

## 2023-05-14 LAB — ANA: Anti Nuclear Antibody (ANA): NEGATIVE

## 2023-05-14 LAB — ESTRADIOL: Estradiol: 29.3 pg/mL (ref 7.6–42.6)

## 2023-05-14 LAB — SEDIMENTATION RATE: Sed Rate: 4 mm/h (ref 0–15)

## 2023-05-14 LAB — C-REACTIVE PROTEIN: CRP: 1 mg/L (ref 0–10)

## 2023-05-14 LAB — TSH: TSH: 1.93 u[IU]/mL (ref 0.450–4.500)

## 2023-05-14 LAB — T4, FREE: Free T4: 0.95 ng/dL (ref 0.82–1.77)

## 2023-05-24 ENCOUNTER — Encounter

## 2023-05-26 ENCOUNTER — Encounter: Payer: Self-pay | Admitting: Medical-Surgical

## 2023-05-26 ENCOUNTER — Ambulatory Visit (INDEPENDENT_AMBULATORY_CARE_PROVIDER_SITE_OTHER): Admitting: Medical-Surgical

## 2023-05-26 ENCOUNTER — Ambulatory Visit: Admitting: Medical-Surgical

## 2023-05-26 VITALS — BP 135/82 | HR 79 | Resp 20 | Ht 72.0 in | Wt 214.2 lb

## 2023-05-26 DIAGNOSIS — R5381 Other malaise: Secondary | ICD-10-CM

## 2023-05-26 DIAGNOSIS — R5383 Other fatigue: Secondary | ICD-10-CM | POA: Diagnosis not present

## 2023-05-26 DIAGNOSIS — G894 Chronic pain syndrome: Secondary | ICD-10-CM | POA: Diagnosis not present

## 2023-05-26 MED ORDER — VENLAFAXINE HCL ER 37.5 MG PO CP24: 37.5000 mg | ORAL_CAPSULE | Freq: Every day | ORAL | 1 refills | Status: AC

## 2023-05-26 NOTE — Progress Notes (Unsigned)
   Established patient visit  History, exam, impression, and plan:  1. Cough, unspecified type 2. Sore throat Pleasant 43 year old male presenting today with reports of upper respiratory symptoms.  Notes that this started approximately 5 days ago with her nose and eyes burning.  On Saturday, she felt unwell and by Sunday she notes that she felt awful.  Her throat has been sore and she has been coughing.  Notes that her cough has been occasionally productive of small amounts of pink-tinged sputum, usually in the morning.  Her left ear has now developed pressure/discomfort and she has significant sinus congestion.  She has tried drinking hot tea and increasing her fluid consumption.  She has also used Chloraseptic for the sore throat.  Continues to have significant issues with hoarseness.  She saw ENT and they recommended just using Atrovent nasal spray twice daily and follow-up with them after a couple of months.  POCT strep, flu, and COVID testing all negative today.  Below for physical exam. - POCT rapid strep A - POCT Influenza A/B - POC COVID-19  3. Viral URI with cough Despite negative testing, suspect that her symptoms are truly related to a viral URI.  Discussed the timeline for resolution of a viral illness since symptoms can last 7 to 14 days.  With her severe hoarseness and significant sinus congestion, treating with Decadron 4 mg twice daily.  Adding Tussionex for cough suppression.  Okay to use Tylenol/ibuprofen for fever/discomfort.  Continue conservative measures at home.  If improvement in symptoms not noted over the next 2 to 3 days or symptoms improved but quickly worsen again, consider adding antibiotic therapy for secondary bacterial infection.   Procedures performed this visit: None.  Return if symptoms worsen or fail to improve.  __________________________________ Thayer Ohm, DNP, APRN, FNP-BC Primary Care and Sports Medicine [facility] [place]

## 2023-05-27 ENCOUNTER — Encounter: Payer: Self-pay | Admitting: Medical-Surgical

## 2023-05-27 DIAGNOSIS — R5381 Other malaise: Secondary | ICD-10-CM | POA: Insufficient documentation

## 2023-06-16 MED ORDER — GABAPENTIN 300 MG PO CAPS: 300.0000 mg | ORAL_CAPSULE | Freq: Three times a day (TID) | ORAL | 5 refills | Status: AC | PRN

## 2023-06-25 ENCOUNTER — Inpatient Hospital Stay: Payer: Self-pay | Admitting: Medical-Surgical

## 2023-07-27 ENCOUNTER — Other Ambulatory Visit: Payer: Self-pay | Admitting: Medical-Surgical

## 2023-09-25 ENCOUNTER — Encounter: Payer: Self-pay | Admitting: Medical-Surgical

## 2023-11-23 ENCOUNTER — Encounter: Payer: Self-pay | Admitting: Sports Medicine

## 2023-12-06 ENCOUNTER — Telehealth: Payer: Self-pay | Admitting: Medical-Surgical

## 2023-12-06 ENCOUNTER — Ambulatory Visit: Payer: Self-pay | Admitting: Medical-Surgical

## 2023-12-06 DIAGNOSIS — I10 Essential (primary) hypertension: Secondary | ICD-10-CM | POA: Diagnosis not present

## 2023-12-06 DIAGNOSIS — Z789 Other specified health status: Secondary | ICD-10-CM

## 2023-12-06 DIAGNOSIS — G894 Chronic pain syndrome: Secondary | ICD-10-CM

## 2023-12-06 NOTE — Progress Notes (Unsigned)
 Virtual Visit via Video Note  I connected with Hunter Lynch on 12/08/23 at  3:20 PM EDT by a video enabled telemedicine application and verified that I am speaking with the correct person using two identifiers.   I discussed the limitations of evaluation and management by telemedicine and the availability of in person appointments. The patient expressed understanding and agreed to proceed.  Patient location: home Provider locations: office  Subjective:    Discussed the use of AI scribe software for clinical note transcription with the patient, who gave verbal consent to proceed.  Hunter Lynch is a 43 year old transgender male who presents for medication management and pre-surgical planning.  Preoperative planning and surgical history - Preparing for upcoming hair transplant and eye surgery in December - Considering facial feminization surgery in January - Previous surgical history includes facelift, neck lift, hairline lowering, and brow lift  Autoimmune symptoms and naturopathic management - Exploring naturopathic treatments for autoimmune symptoms - Using a tincture containing chamomile, ginger, turmeric, and white willow bark three times daily - Symptom improvement with naturopathic regimen  Hypertension and blood pressure management - Discontinued antihypertensive medication - Currently using Milk Thistle and [place] for blood pressure control - Blood pressure readings consistently under 100  Chronic pain and sleep disturbance - Uses CBD and Kratom for pain management - Insomnia associated with higher doses of Kratom - Discontinued gabapentin  due to concerns about dementia and withdrawal difficulty - Considering pregabalin as an alternative for pain management - Gabapentin  previously provided sedative effect and nighttime pain relief - Currently taking valerian root for sleep  Inflammation and psychiatric medication history - Concerned about systemic  inflammation and its effects - Naturopathic treatments perceived as beneficial for inflammation control - Currently off Effexor  - Previous trials of Celebrex and Cymbalta without success  Hormone therapy - Currently prescribed estradiol  tablets with good effect - Considering further hormone therapy after completion of planned surgeries  Past medical history, Surgical history, Family history not pertinant except as noted below, Social history, Allergies, and medications have been entered into the medical record, reviewed, and corrections made.   Review of Systems: See HPI for pertinent positives and negatives.   Objective:    General: Speaking clearly in complete sentences without any shortness of breath.  Alert and oriented x3.  Normal judgment. No apparent acute distress.  Impression and Recommendations:    Chronic pain syndrome Chronic pain significantly affects her life. Prefers gabapentin  as it helps with sleep. Lyrica discussed as an alternative. Has been using naturopathic supplements for pain control but these must be stopped for the upcoming surgery. - Prescribe gabapentin  for nighttime use. - Consider Lyrica if needed.  Essential hypertension Hypertension managed with metoprolol . Naturopathic supplements to be discontinued pre-surgery. Metoprolol  to be resumed for blood pressure control. - Prescribe metoprolol  during supplement discontinuation. - Monitor blood pressure regularly. - Start metoprolol  before stopping supplements.  Hormone therapy, transgender male Estradiol  therapy ongoing and well-tolerated. Hormone therapy adjustments considered post-surgery. - Continue estradiol  tablets. - Re-evaluate hormone therapy post-surgery.   I discussed the assessment and treatment plan with the patient. The patient was provided an opportunity to ask questions and all were answered. The patient agreed with the plan and demonstrated an understanding of the instructions.   The  patient was advised to call back or seek an in-person evaluation if the symptoms worsen or if the condition fails to improve as anticipated.  Return if symptoms worsen or fail to improve.  Zada FREDRIK Palin, DNP,  APRN, FNP-BC [facility] and Sports Medicine

## 2023-12-08 ENCOUNTER — Encounter: Payer: Self-pay | Admitting: Medical-Surgical

## 2023-12-08 DIAGNOSIS — I10 Essential (primary) hypertension: Secondary | ICD-10-CM | POA: Insufficient documentation

## 2023-12-09 ENCOUNTER — Other Ambulatory Visit: Payer: Self-pay | Admitting: Medical-Surgical

## 2023-12-21 ENCOUNTER — Encounter: Payer: Self-pay | Admitting: Medical-Surgical

## 2024-01-05 ENCOUNTER — Ambulatory Visit: Admitting: Medical-Surgical

## 2024-01-05 ENCOUNTER — Encounter: Payer: Self-pay | Admitting: Medical-Surgical

## 2024-01-05 VITALS — BP 118/79 | HR 74 | Resp 20 | Ht 72.0 in | Wt 210.0 lb

## 2024-01-05 DIAGNOSIS — Z4802 Encounter for removal of sutures: Secondary | ICD-10-CM | POA: Diagnosis not present

## 2024-01-05 DIAGNOSIS — F419 Anxiety disorder, unspecified: Secondary | ICD-10-CM | POA: Insufficient documentation

## 2024-01-05 DIAGNOSIS — B078 Other viral warts: Secondary | ICD-10-CM | POA: Diagnosis not present

## 2024-01-05 DIAGNOSIS — F1921 Other psychoactive substance dependence, in remission: Secondary | ICD-10-CM | POA: Diagnosis not present

## 2024-01-05 NOTE — Assessment & Plan Note (Signed)
 History of polysubstance abuse including opioids however has been in remission for approximately 10 years.  Did have 1 episode of relapse but this was quickly addressed.  Has a plan in place for any controlled substances including opioids and gabapentin .  Her husband keeps medications locked in a safe that she does not have access to and only pulls out doses when due/as needed.  Feel that she is a low risk for return to substance abuse and the history should not be of concern when controlling postsurgical pain.

## 2024-01-05 NOTE — Progress Notes (Signed)
        Established patient visit  History, exam, impression, and plan:  1. Visit for suture removal (Primary) Pleasant 43 year old transgender male presenting today for suture removal after having her transplant surgery.  Sutures have remained in place and healing has proceeded as expected.  No tenderness or pain at the site and has had no indications of infection.  The suture was placed in a continuous running stitch to facilitate removal.  She advises that the suture is dissolvable so if there are any that are difficult to be removed, leaving them will be fine.  Evaluation, wound edges well-approximated with appropriate wound healing.  Very fine suture material visible.  Sutures mostly removed with only a couple of areas that we were unable to get to.  Advised to monitor for infection and return for further evaluation if needed.  2. Other viral warts Has a history of having viral warts on a couple of her fingers on the left hand that were cut out.  Unfortunately these have recurred.  She would like to have them treated with cryotherapy today.  See below for procedure note.  3. 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  Did have 1 episode of relapse but this was quickly addressed.  Has a plan in place for any controlled substances including opioids and gabapentin .  Her husband keeps medications locked in a safe that she does not have access to and only pulls out doses when due/as needed.  Feel that she is a low risk for return to substance abuse and the history should not be of concern when controlling postsurgical pain.   Procedures performed this visit: Cryotherapy template Procedure: Cryodestruction of: 4 viral warts on the left 2nd and 3rd finger, various sizes, flesh toned Consent obtained and verified. Time-out  conducted. Noted no overlying erythema, induration, or other signs of local infection. Completed without difficulty using Cryo-Gun. Advised to call if fevers/chills, erythema, induration, drainage, or persistent bleeding.  Return if symptoms worsen or fail to improve.  __________________________________ Zada FREDRIK Palin, DNP, APRN, FNP-BC Primary Care and Sports Medicine [facility] [place]

## 2024-02-01 ENCOUNTER — Encounter: Payer: Self-pay | Admitting: Medical-Surgical

## 2024-02-01 ENCOUNTER — Ambulatory Visit: Admitting: Medical-Surgical

## 2024-02-01 VITALS — BP 125/81 | HR 76 | Resp 20 | Ht 72.0 in | Wt 213.4 lb

## 2024-02-01 DIAGNOSIS — F1921 Other psychoactive substance dependence, in remission: Secondary | ICD-10-CM

## 2024-02-01 DIAGNOSIS — B078 Other viral warts: Secondary | ICD-10-CM | POA: Diagnosis not present

## 2024-02-01 DIAGNOSIS — F332 Major depressive disorder, recurrent severe without psychotic features: Secondary | ICD-10-CM | POA: Diagnosis not present

## 2024-02-01 NOTE — Progress Notes (Unsigned)
        Established patient visit  History, exam, impression, and plan:  Primary male hypogonadism Up-to-date testosterone labs.  Due for his next testosterone injection.  Doing well on his current regimen with stable vitals.  Testosterone cypionate 100 mg IM given x 1 in the office.  Due for next shot in 2 weeks.  Lump of skin of lower extremity, left Noted a lump on the left posterior calf approximately 1 month ago.  Initially, had redness, tenderness, swelling at the area however this has fully resolved.  No longer has any residual symptoms but the lump has remained.  His wife noticed it and urged him to be seen to evaluate it.  History notable for varicose veins, compliant with recommendations for compression socks daily.  No recent injury or trauma to the area.  Unclear etiology.  The lump is approximately 1 cm x 1.5 cm and slightly discolored.  See clinical photo.  Plan to get vascular ultrasound as it does seem to be connected to one of the varicose veins that runs through the area.  Suspect superficial thrombophlebitis initially.  Discussed conservative measures with Tylenol, heat, ice, and compression socks.    Procedures performed this visit: None.  Return in about 2 weeks (around 07/01/2022) for nurse visit for testosterone shot.  __________________________________ Joy L. Jessup, DNP, APRN, FNP-BC Primary Care and Sports Medicine [facility] [place]  

## 2024-02-02 ENCOUNTER — Encounter: Payer: Self-pay | Admitting: Medical-Surgical

## 2024-02-06 ENCOUNTER — Encounter: Payer: Self-pay | Admitting: Medical-Surgical

## 2024-02-07 ENCOUNTER — Other Ambulatory Visit: Payer: Self-pay | Admitting: Medical-Surgical

## 2024-02-07 MED ORDER — METOPROLOL SUCCINATE ER 25 MG PO TB24: 25.0000 mg | ORAL_TABLET | Freq: Two times a day (BID) | ORAL | 3 refills | Status: AC

## 2024-02-07 NOTE — Telephone Encounter (Signed)
 Per patient message - she has been taking metoprolol  2 per day instead of one -requesting rx rf  as 2 per day  Last written 12/09/2023 Last OV 02/01/2024 Upcoming appt = none

## 2024-04-19 ENCOUNTER — Ambulatory Visit: Admitting: Medical-Surgical

## 2024-04-19 NOTE — Telephone Encounter (Signed)
 Copied from CRM [id number]. Topic: Appointments - Appointment Info/Confirmation >> Apr 19, 2024 11:28 AM Hunter Lynch wrote: Pt called and stated her appt was scheduled for this morning and she was making sure she was still going tob e seen. She says she is also in the hospital and has to step away as the hospital dr just stepped in
